# Patient Record
Sex: Male | Born: 1935 | Race: White | Hispanic: No | Marital: Married | Smoking: Never smoker
Health system: Southern US, Community
[De-identification: ages and names within clinical notes are randomized; demographics above are authoritative.]

## PROBLEM LIST (undated history)

## (undated) ENCOUNTER — Inpatient Hospital Stay: Admission: EM | Payer: Self-pay | Source: Home / Self Care

## (undated) DIAGNOSIS — R351 Nocturia: Secondary | ICD-10-CM

## (undated) DIAGNOSIS — R350 Frequency of micturition: Secondary | ICD-10-CM

## (undated) DIAGNOSIS — I6521 Occlusion and stenosis of right carotid artery: Secondary | ICD-10-CM

## (undated) DIAGNOSIS — E785 Hyperlipidemia, unspecified: Secondary | ICD-10-CM

## (undated) DIAGNOSIS — Z8719 Personal history of other diseases of the digestive system: Secondary | ICD-10-CM

## (undated) DIAGNOSIS — N183 Chronic kidney disease, stage 3 unspecified: Secondary | ICD-10-CM

## (undated) DIAGNOSIS — K219 Gastro-esophageal reflux disease without esophagitis: Secondary | ICD-10-CM

## (undated) DIAGNOSIS — R3915 Urgency of urination: Secondary | ICD-10-CM

## (undated) DIAGNOSIS — R0602 Shortness of breath: Secondary | ICD-10-CM

## (undated) DIAGNOSIS — E119 Type 2 diabetes mellitus without complications: Secondary | ICD-10-CM

## (undated) DIAGNOSIS — Z8711 Personal history of peptic ulcer disease: Secondary | ICD-10-CM

## (undated) DIAGNOSIS — N400 Enlarged prostate without lower urinary tract symptoms: Secondary | ICD-10-CM

## (undated) DIAGNOSIS — E039 Hypothyroidism, unspecified: Secondary | ICD-10-CM

## (undated) HISTORY — PX: TOTAL KNEE ARTHROPLASTY: SHX125

## (undated) HISTORY — PX: CARDIAC CATHETERIZATION: SHX172

## (undated) HISTORY — DX: Hyperlipidemia, unspecified: E78.5

---

## 1992-04-15 HISTORY — PX: LUMBAR DISC SURGERY: SHX700

## 1997-08-19 ENCOUNTER — Other Ambulatory Visit: Admission: RE | Admit: 1997-08-19 | Discharge: 1997-08-19 | Payer: Self-pay | Admitting: Family Medicine

## 1998-03-06 ENCOUNTER — Other Ambulatory Visit: Admission: RE | Admit: 1998-03-06 | Discharge: 1998-03-06 | Payer: Self-pay | Admitting: Family Medicine

## 1999-04-03 ENCOUNTER — Encounter: Payer: Self-pay | Admitting: Family Medicine

## 1999-04-03 ENCOUNTER — Encounter: Admission: RE | Admit: 1999-04-03 | Discharge: 1999-04-03 | Payer: Self-pay | Admitting: Family Medicine

## 1999-06-20 ENCOUNTER — Encounter: Payer: Self-pay | Admitting: Family Medicine

## 1999-06-20 ENCOUNTER — Encounter: Admission: RE | Admit: 1999-06-20 | Discharge: 1999-06-20 | Payer: Self-pay | Admitting: Family Medicine

## 2000-01-07 ENCOUNTER — Ambulatory Visit (HOSPITAL_COMMUNITY): Admission: RE | Admit: 2000-01-07 | Discharge: 2000-01-07 | Payer: Self-pay | Admitting: Interventional Cardiology

## 2001-12-01 ENCOUNTER — Encounter: Payer: Self-pay | Admitting: Family Medicine

## 2001-12-01 ENCOUNTER — Encounter: Admission: RE | Admit: 2001-12-01 | Discharge: 2001-12-01 | Payer: Self-pay | Admitting: Family Medicine

## 2002-05-27 ENCOUNTER — Encounter: Payer: Self-pay | Admitting: Family Medicine

## 2002-05-27 ENCOUNTER — Encounter: Admission: RE | Admit: 2002-05-27 | Discharge: 2002-05-27 | Payer: Self-pay | Admitting: Family Medicine

## 2002-06-24 ENCOUNTER — Encounter: Payer: Self-pay | Admitting: *Deleted

## 2002-06-24 ENCOUNTER — Encounter: Admission: RE | Admit: 2002-06-24 | Discharge: 2002-06-24 | Payer: Self-pay | Admitting: *Deleted

## 2002-07-06 ENCOUNTER — Encounter: Payer: Self-pay | Admitting: *Deleted

## 2002-07-06 ENCOUNTER — Encounter: Admission: RE | Admit: 2002-07-06 | Discharge: 2002-07-06 | Payer: Self-pay | Admitting: *Deleted

## 2003-05-03 ENCOUNTER — Encounter: Admission: RE | Admit: 2003-05-03 | Discharge: 2003-05-03 | Payer: Self-pay | Admitting: Family Medicine

## 2003-05-10 ENCOUNTER — Ambulatory Visit (HOSPITAL_COMMUNITY): Admission: RE | Admit: 2003-05-10 | Discharge: 2003-05-10 | Payer: Self-pay | Admitting: Physical Therapy

## 2003-05-10 ENCOUNTER — Encounter (INDEPENDENT_AMBULATORY_CARE_PROVIDER_SITE_OTHER): Payer: Self-pay | Admitting: *Deleted

## 2004-10-04 ENCOUNTER — Ambulatory Visit: Payer: Self-pay | Admitting: Physical Medicine & Rehabilitation

## 2004-10-04 ENCOUNTER — Inpatient Hospital Stay (HOSPITAL_COMMUNITY): Admission: RE | Admit: 2004-10-04 | Discharge: 2004-10-08 | Payer: Self-pay | Admitting: Orthopedic Surgery

## 2005-04-12 IMAGING — RF DG ESOPHAGUS
12 series · 19 of 24 positions shown · non-contrast
Comparison: none

CLINICAL DATA: The patient has dysphagia.
 ESOPHAGRAM
 The study was performed with air-contrast technique with the patient in the erect, prone, and supine positions.
 The swallowing function is satisfactory.  There is noted to be mild cervical spondylosis with minimal extrinsic compression on the cervical esophagus.  There is also noted to be Montell Fred?Sayaana diverticulum in the cervical esophagus.  No obstruction is noted to the passage of a tablet in the cervical esophagus.  There is noted to be mild diffuse spasm of the esophagus.  There is a Schatzki?s ring with an associated sliding type hiatal hernia.  The 13 mm tablet is temporarily obstructed in the region of the Schatzki?s ring.  There is noted to be moderate gastroesophageal reflux with the water siphon test.
 IMPRESSION
 Karime Beharry?Sayaana diverticulum in the cervical esophagus.
 Diffuse spasm of the esophagus with a Schatzki?s ring with an associated hiatal hernia which partially obstructs the passage of a barium tablet.  There is moderate associated gastroesophageal reflux.  
 Endoscopy would be suggested for further evaluation, particularly in regards to the Schatzki?s ring and hiatal hernia since there may well be an area of stricture formation of the distal esophagus which does not exactly correspond to the Schatzki?s ring or the gastroesophageal junction area.

[Series 1: run · 1 of 2 slices shown (1 of 12)]
[im 1/2]
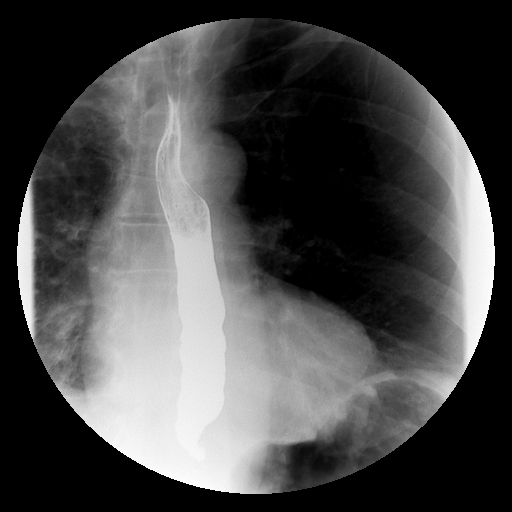

[Series 2: run · 1 of 6 slices shown (2 of 12)]
[im 1/6]
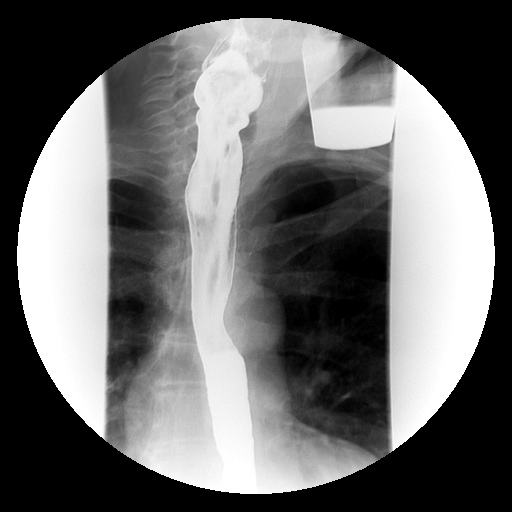

[Series 3: run · 2 of 4 slices shown (3 of 12)]
[im 1/4]
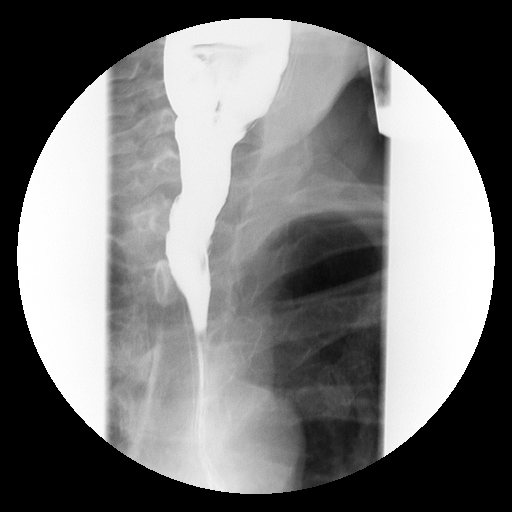
[im 4/4]
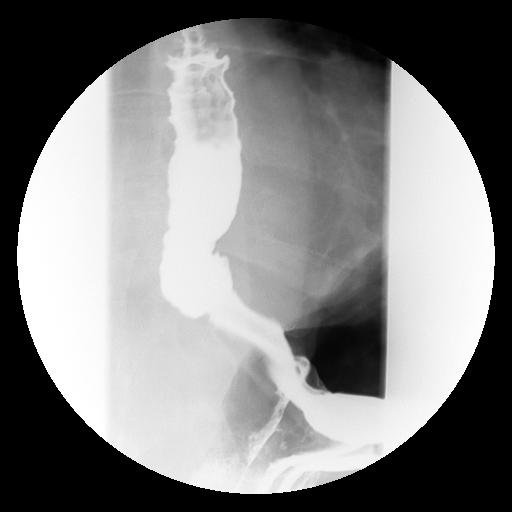

[Series 4: run · 1 of 2 slices shown (4 of 12)]
[im 1/2]
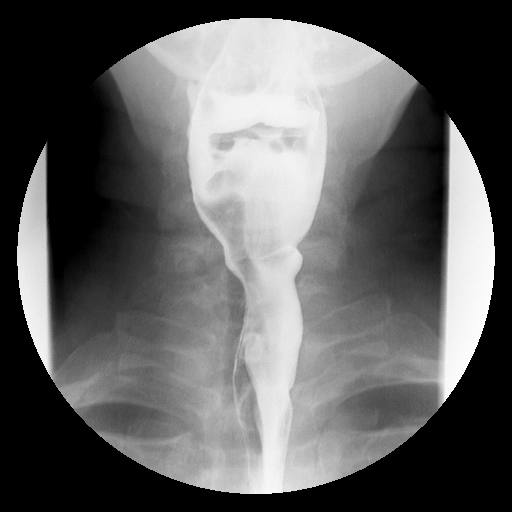

[Series 5: run · 3 of 7 slices shown (5 of 12)]
[im 1/7]
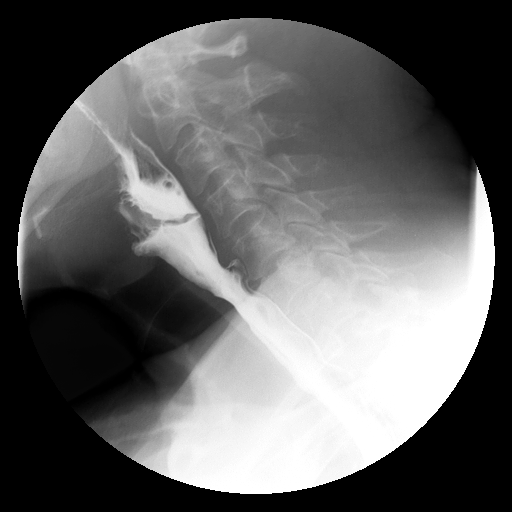
[im 5/7]
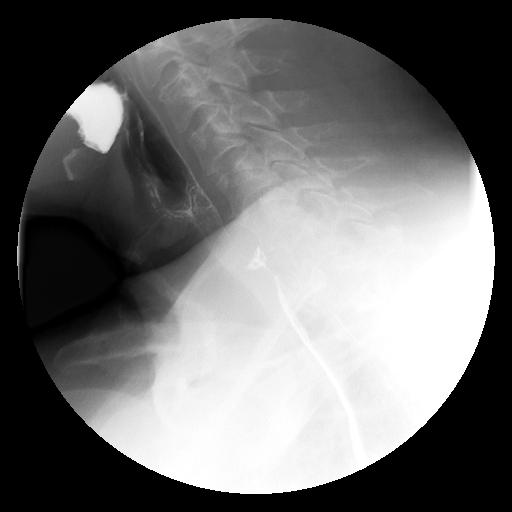
[im 7/7]
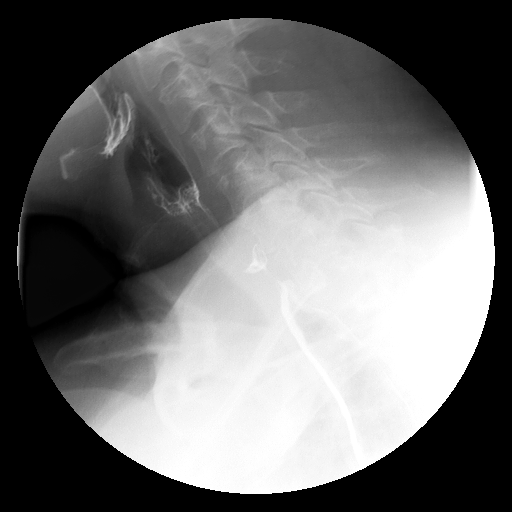

[Series 6: run · 3 of 9 slices shown (6 of 12)]
[im 1/9]
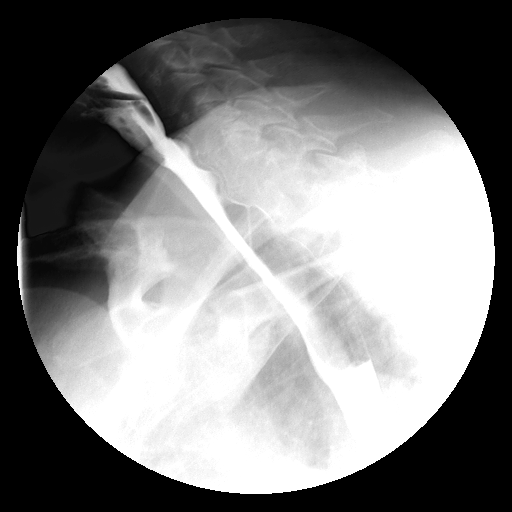
[im 6/9]
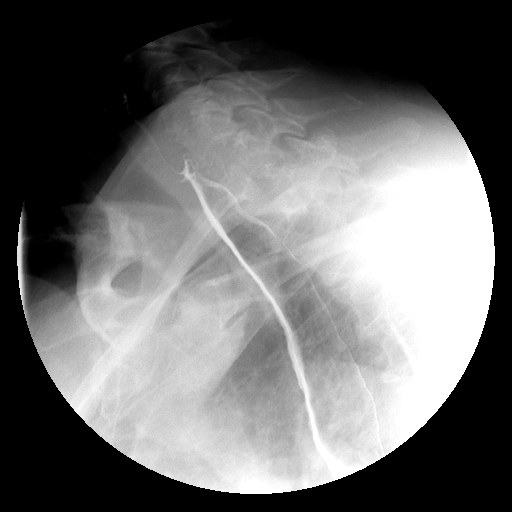
[im 9/9]
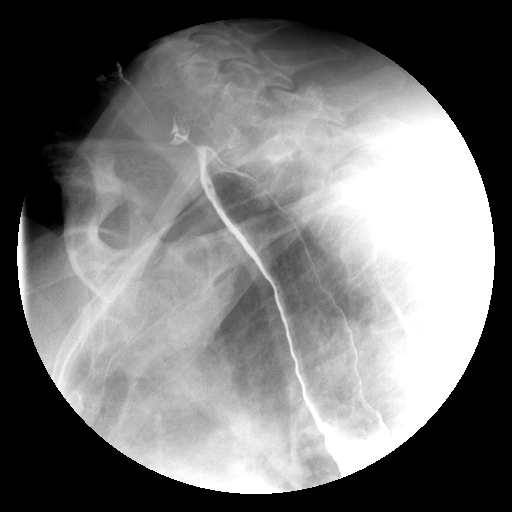

[Series 7: run · 1 of 2 slices shown (7 of 12)]
[im 1/2]
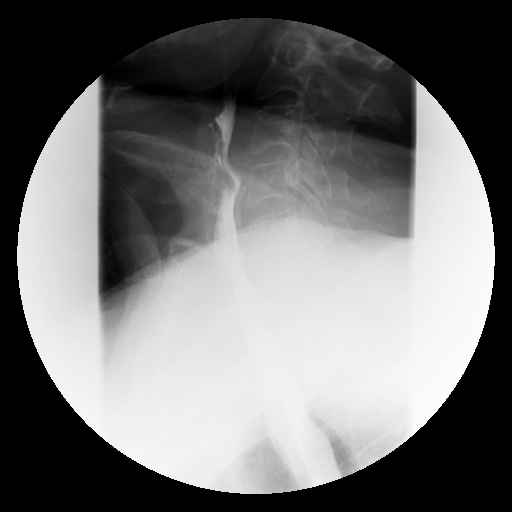

[Series 8: run · 1 of 3 slices shown (8 of 12)]
[im 1/3]
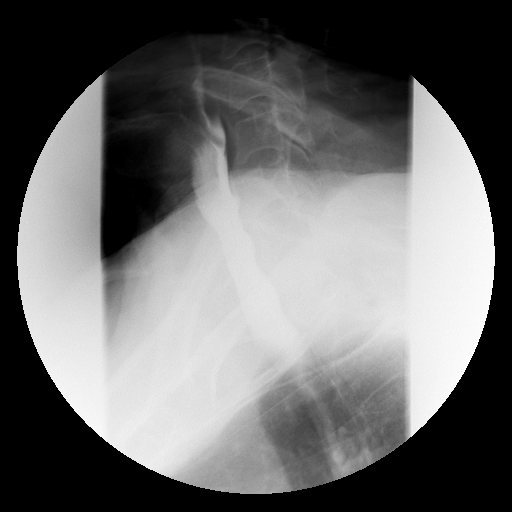

[Series 9: run · 3 of 6 slices shown (9 of 12)]
[im 1/6]
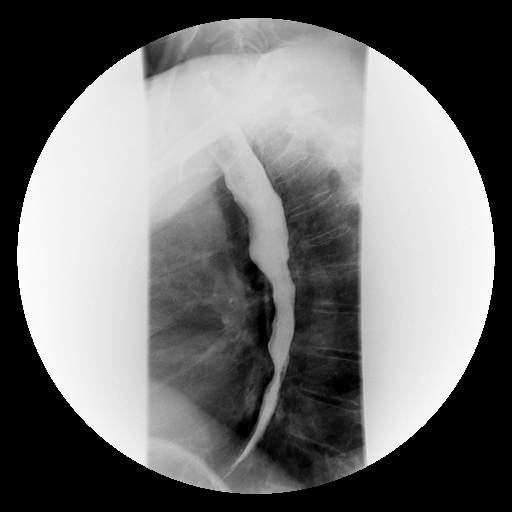
[im 3/6]
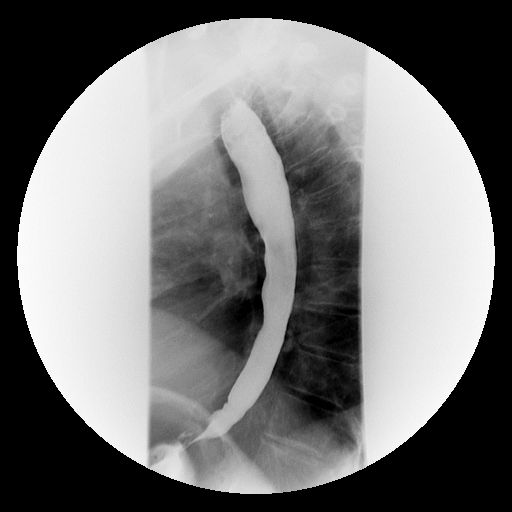
[im 6/6]
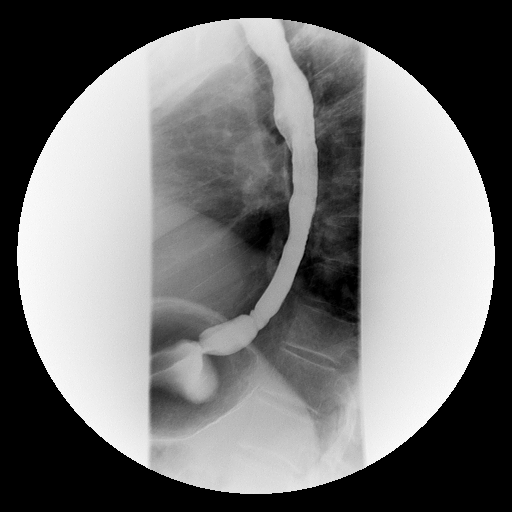

[Series 11: run · 1 of 1 slices shown (10 of 12)]
[im 1/1]
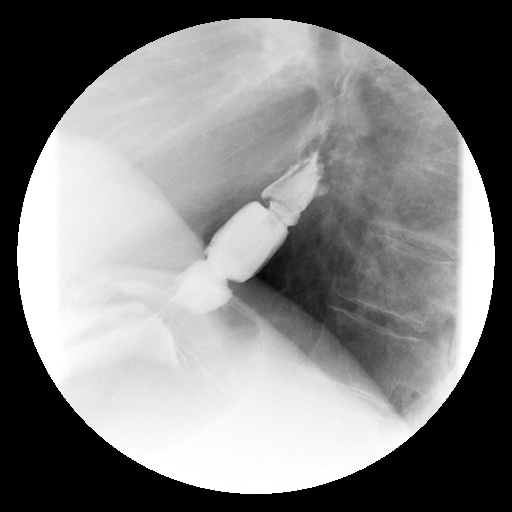

[Series 12: run · 1 of 5 slices shown (11 of 12)]
[im 5/5]
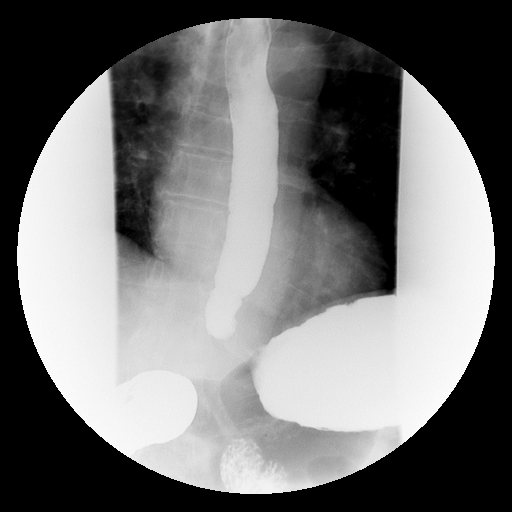

[Series 14: run · 1 of 1 slices shown (12 of 12)]
[im 1/1]
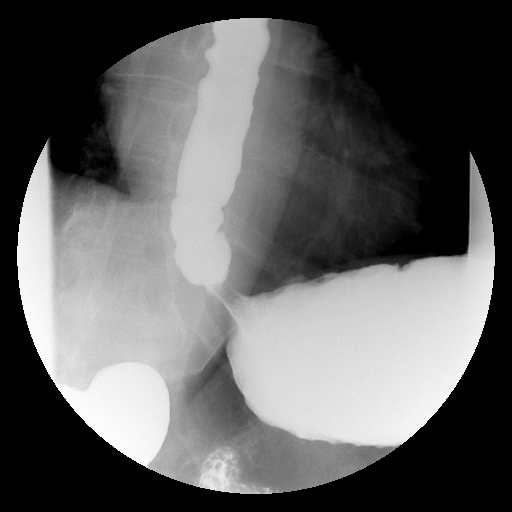

[19 of 24 positions shown; findings below may reference images not displayed]

## 2005-10-10 ENCOUNTER — Encounter: Admission: RE | Admit: 2005-10-10 | Discharge: 2005-10-10 | Payer: Self-pay | Admitting: Family Medicine

## 2008-03-17 ENCOUNTER — Inpatient Hospital Stay (HOSPITAL_COMMUNITY): Admission: RE | Admit: 2008-03-17 | Discharge: 2008-03-20 | Payer: Self-pay | Admitting: Orthopedic Surgery

## 2010-08-28 NOTE — Op Note (Signed)
NAME:  Joshua [name], Joshua [name] NO.:  [id number]   MEDICAL RECORD NO.:  [id number]          PATIENT TYPE:  INP   LOCATION:  5002                         FACILITY:  [facility]   PHYSICIAN:  Nadara Mustard, MD     DATE OF BIRTH:  1928-07-11   DATE OF PROCEDURE:  03/17/2008  DATE OF DISCHARGE:                               OPERATIVE REPORT   PREOPERATIVE DIAGNOSIS:  Osteoarthritis, right knee.   POSTOPERATIVE DIAGNOSIS:  Osteoarthritis, right knee.   PROCEDURE:  Right total knee arthroplasty with DePuy components #4  femur, #4 tibia, 10-mm poly tray with a 38-mm patella.   SURGEON:  Nadara Mustard, MD   ANESTHESIA:  General plus femoral block.   ESTIMATED BLOOD LOSS:  Minimal.   ANTIBIOTICS:  1 g of Kefzol.   DRAINS:  None.   COMPLICATIONS:  None.   TOURNIQUET TIME:  44 minutes at 350 mmHg to the thigh.   DISPOSITION:  To PACU in stable condition.   INDICATIONS FOR PROCEDURE:  The patient is a 77 year old gentleman who  is status post a left total knee arthroplasty.  He is having increasing  pain in the right knee and presents at this time for right total knee  arthroplasty.  The risks and benefits were discussed including  infection, neurovascular injury, persistent pain, need for additional  surgery, as well as DVT and pulmonary embolus.  The patient states he  understands and wished to proceed at this time.   DESCRIPTION OF PROCEDURE:  The patient was brought to OR room #4 and  underwent a general anesthetic after a femoral block.  After adequate  level of anesthesia was obtained, the patient's right lower extremity  was prepped using DuraPrep and draped in a sterile field.  An [name] was  used to cover all exposed skin.  A midline incision was made, carried  down with a medial parapatellar retinacular incision.  The patella was  everted.  The drill was used for the start of the IM entry portal for  the IM femoral guide.  This was set for 5 degrees of valgus.  The  distal  cut was made at 11 mm, this was sized for a size #4 and a size #4  chamfer block was placed and size #4 chamfer cuts were made.  Attention  was then focused on the tibia.  External alignment was used for neutral  varus valgus and neutral posterior slope.  This was set to take 10 mm  off the lateral tibial plateau.  The cut was made and there was still  tightness in the medial joint line, and the medial capsule was elevated  subperiosteally to balance the ligaments.  The #4 tibial trial was  placed, and the keel punch was made.  The box cut was then made on the  femur followed by placing the trial femoral implant, the lug holes were  drilled, size #10 tibial tray was placed, and the knee was placed  through a full range of motion.  There was full extension and flexion.  There was stable varus and valgus stress with the ligaments.  Trial  instruments removed.  The patella was resurfaced, 10 mm was taken off  the patella, and a size 38 lug cuts were made for the patella.  The knee  was irrigated with pulsatile lavage.  The cement was mixed.  The  popliteal fossa was injected with 30 mL of 0.5% Marcaine plain.  Care  was taken not to have the injection intravascular.  The tibial tray was  cemented in place.  The femoral tray was cemented in place.  The loose  cement was removed.  The wound was irrigated with pulsatile lavage.  The  tibial tray was placed.  The knee was kept in extension until the cement  had hardened.  The patella was then cemented in place and also left  clamped until the cement hardened.  The knee was irrigated with  pulsatile lavage.  The tourniquet was deflated after 44 minutes and  hemostasis was obtained.  The knee was then placed through a full range  of motion after the cement had hardened and the patella tracked midline,  there was no subluxation.  The retinaculum was closed using #1 Vicryl,  subcu was closed using 2-0 Vicryl, the skin was closed using  Proximate  staples.  The wound was covered with Adaptic, orthopedic sponges, ABD  dressing, Webril, and Coban.  The patient was extubated and taken to  PACU in stable condition.      Nadara Mustard, MD  Electronically Signed     MVD/MEDQ  D:  03/17/2008  T:  03/17/2008  Job:  [id number]

## 2010-08-31 NOTE — Cardiovascular Report (Signed)
[place]  Patient:    Joshua [name], Joshua [name]                  MRN: [id number] Proc. Date: 01/07/00 Adm. Date:  [id number] Attending:  Lyn Records. Iii CC:         Dyanne Carrel, M.D.   Cardiac Catheterization  INDICATIONS FOR PROCEDURE:  Recent stress Cardiolite suggesting an inferior infarct with peri-infarct ischemia in a patient with risks factors for coronary artery disease.  PROCEDURES PERFORMED: 1. Left heart catheterization. 2. Selective coronary angiography. 3. Hand injection left ventriculography.  DESCRIPTION OF PROCEDURE:  After informed consent, a 6 French sheath was inserted into the right femoral artery using the modified Seldinger technique. Due to severe tortuosity and peri-sheath oozing, we upgraded to a 7 French 23 cm long sheath.  This did decrease the peri-sheath bleeding and made catheter manipulation somewhat easier.  We then used a multipurpose catheter and were able via wire manipulation to enter the left ventricle and perform a hand injection.  We then used a #4 left Judkins and a #4 right Judkins 6 French catheters for selective coronary angiography.  The patient tolerated the procedure without complications.  The sheath was removed and hemostasis achieved manually.  RESULTS:  I:  HEMODYNAMIC DATA:     a. The aortic pressure 152/83 mmHg.     b. Left ventricular pressure 160/13 mmHg.     c. No pullback was made because the catheter recoiled into the        ascending aorta during ventriculography.  II:  LEFT VENTRICULOGRAPHY:  The left ventricle is faintly opacified by hand injection and appears to be overall normal.  III:  SELECTIVE CORONARY ANGIOGRAPHY:     a. Left main:  The left main coronary artery is normal.     b. Left anterior descending coronary artery:  The left anterior        descending coronary artery is a large vessel that wraps around the        left ventricular apex.  It gives origin  to two large diagonal branches.        Luminal irregularities are noted in the mid LAD between a first and        second diagonal.  No significant obstruction is noted in the LAD.     c. Circumflex artery:  The circumflex artery is a large vessel giving        origin to one dominant obtuse marginal that trifurcates on the left        lateral wall and is free of any significant obstruction.     d. Right coronary artery:  The right coronary artery is large, dominant,        giving origin to a PDA and two large left ventricular branches.        Minimal plaquing is noted in the right coronary particularly after the        acute marginal branch in the mid RCA that obstructs the vessel by up to        25%.  CONCLUSIONS: 1. Essentially normal coronary arteries with minimal plaquing noted on the    right coronary and left anterior descending. 2. Normal left ventricular function. 3. Severe tortuosity in the aortoiliac and femoral territory making the    procedure more difficult but no complications occurred. 4. Falsely abnormal stress Cardiolite.  The inferior wall defect possibly    represents diaphragm attenuation.  RECOMMENDATIONS:  No further cardiac evaluation. DD:  01/07/00 TD:  01/07/00 Job: [id number]

## 2010-08-31 NOTE — Op Note (Signed)
NAME:  Joshua [name], Joshua [name]                     ACCOUNT NO.:  [id number]   MEDICAL RECORD NO.:  [id number]                   PATIENT TYPE:  AMB   LOCATION:  ENDO                                 FACILITY:  [facility]   PHYSICIAN:  John C. Madilyn Fireman, M.D.                 DATE OF BIRTH:  12-01-1928   DATE OF PROCEDURE:  05/10/2003  DATE OF DISCHARGE:                                 OPERATIVE REPORT   PROCEDURE:  Esophagogastroduodenoscopy with esophageal dilatation.   INDICATION FOR PROCEDURE:  Dysphagia with lower esophageal ring and Zenker's  diverticulum seen on barium swallow.   DESCRIPTION OF PROCEDURE:  The patient was placed in the left lateral  decubitus position and placed on the pulse monitor with continuous low-flow  oxygen delivered by nasal cannula.  He was sedated with 50 mg mcg IV  fentanyl and 6 mg IV Versed.  The Olympus video endoscope was advanced under  direct vision into the oropharynx and esophagus.  The esophagus was straight  and of normal caliber with the squamocolumnar line at 36 cm above a 2 cm  hiatal hernia and a distinctive lower esophageal ring seen at the Z-line.  There was no resistance to passage of the scope beyond it.  The stomach was  entered, and a small amount of liquid secretions were suctioned from the  fundus.  Retroflexed view of the cardia was unremarkable.  The fundus, body,  antrum, and pylorus all appeared normal.  The duodenum was entered, and both  the bulb and second portion were well-inspected and appeared to be within  normal limits.  The Savary guidewire was placed through the endoscope  channel and the scope withdrawn.  Savary dilators of 16 and 17 mm were  passed consecutively over the guidewire with no significant resistance and  no blood seen on withdrawal.  The last dilator was removed together with the  wire and the patient prepared for colonoscopy.  He tolerated the procedure  well, and there were no immediate complications.   IMPRESSION:  Lower esophageal ring with hiatal hernia.   PLAN:  Advance diet and observe response to dilatation.                                               John C. Madilyn Fireman, M.D.    JCH/MEDQ  D:  05/10/2003  T:  05/10/2003  Job:  [id number]   cc:   Bryan Lemma. Manus Gunning, M.D.  [address] [place]  [place] [address]  Fax: [phone]

## 2010-08-31 NOTE — Discharge Summary (Signed)
NAME:  Joshua [name], Joshua [name] NO.:  [id number]   MEDICAL RECORD NO.:  [id number]          PATIENT TYPE:  INP   LOCATION:  5002                         FACILITY:  [facility]   PHYSICIAN:  Nadara Mustard, MD     DATE OF BIRTH:  1929/04/14   DATE OF ADMISSION:  03/17/2008  DATE OF DISCHARGE:  03/20/2008                               DISCHARGE SUMMARY   FINAL DIAGNOSIS:  Osteoarthritis, right knee.   PROCEDURE:  Right total knee arthroplasty with DePuy components of #4  femur, #4 tibia, 10-mm poly tray, and a 38-mm patella.   Discharged to home in stable condition with [facility], home  health physical therapy, occupational therapy, and CPM.  Prescription  for Tylox, Vicodin, and Coumadin.  Followup in the office in 2 weeks.   HISTORY OF PRESENT ILLNESS:  The patient is a 77 year old gentleman with  osteoarthritis of the right knee, has failed conservative care as pain  with active daily living and presents at this time for total knee  arthroplasty.  The patient's hospital course was essentially  unremarkable.  Postoperatively, he received Kefzol for infection  prophylaxis and Coumadin for DVT prophylaxis.  His diabetes was  controlled with sliding scale insulin and the diabetic diet.  His  hemoglobin dropped to 9.6.  On postoperative day #1, he was started on  physical therapy.  Weightbearing as tolerated.  He had decreased range  of motion of his knee and was started on a CPM machine to improve the  range of motion.  The patient was discharged to home in stable condition  on March 20, 2008, with followup in the office in 2 weeks with  [facility] for therapy and CPM.  His hemoglobin was stable at  8.8 at the time of discharge.  He was discharged with Coumadin for DVT  prophylaxis, and Tylox and Vicodin for pain.      Nadara Mustard, MD  Electronically Signed     MVD/MEDQ  D:  05/05/2008  T:  05/05/2008  Job:  [id number]

## 2010-08-31 NOTE — Discharge Summary (Signed)
NAME:  [name], [name] NO.:  [id number]   MEDICAL RECORD NO.:  [id number]          PATIENT TYPE:  INP   LOCATION:  5032                         FACILITY:  [facility]   PHYSICIAN:  Nadara Mustard, MD     DATE OF BIRTH:  1929-03-02   DATE OF ADMISSION:  10/04/2004  DATE OF DISCHARGE:  10/08/2004                                 DISCHARGE SUMMARY   DIAGNOSIS:  Osteoarthritis, left knee.   PROCEDURE:  Left total knee arthroplasty.   DISPOSITION:  Discharged to home in stable condition with home health  physical therapy from [facility], prescriptions for Vicodin and  Coumadin.   HISTORY OF PRESENT ILLNESS:  The patient is a 77 year old gentleman with  osteoarthritis of the left hip.  He has failed conservative care, has pain  with activities of daily living and wishes to proceed with total knee  arthroplasty.   HOSPITAL COURSE:  Hospital course was essentially unremarkable.  The patient  underwent a left total knee arthroplasty on October 04, 2004.  The patient  received Kefzol for infection prophylaxis and Coumadin for DVT prophylaxis.  Postoperatively, the patient progressed well.  Radiographs shows stable  alignment.  Rehab was consulted; the patient was felt to be safe for  discharge to home and the patient was discharged to home October 08, 2004 with  followup in office in 2 weeks.      Nadara Mustard, MD  Electronically Signed     MVD/MEDQ  D:  12/13/2004  T:  12/13/2004  Job:  [id number]

## 2010-08-31 NOTE — Op Note (Signed)
NAME:  Joshua [name], Joshua [name] NO.:  [id number]   MEDICAL RECORD NO.:  [id number]          PATIENT TYPE:  INP   LOCATION:  2899                         FACILITY:  [facility]   PHYSICIAN:  Nadara Mustard, MD     DATE OF BIRTH:  02/07/1929   DATE OF PROCEDURE:  10/04/2004  DATE OF DISCHARGE:                                 OPERATIVE REPORT   PREOPERATIVE DIAGNOSIS:  Osteoarthritis, left knee.   POSTOPERATIVE DIAGNOSES:  Osteoarthritis, left knee.   PROCEDURE:  Left total knee arthroplasty with DePuy components, #4 tibia, #4  femur, 10 mm poly tray with a 38-mm patella.   SURGEON.:  Nadara Mustard, MD   ANESTHESIA:  General.   ESTIMATED BLOOD LOSS:  Minimal.   ANTIBIOTICS:  1 gram of Kefzol.   TOURNIQUET TIME:  58 minutes at 300 mmHg at the thigh.   DISPOSITION:  To PACU in stable condition.   INDICATIONS FOR PROCEDURE:  The patient is a 76 year old gentleman with  osteoarthritis of his left knee.  He has failed conservative care and  presents at this time for left total knee arthroplasty. Risks and benefits  were discussed including infection, neurovascular injury, persistent pain,  failure of components, DVT, embolus. The patient states he understands and  wished proceed at this time.   DESCRIPTION OF PROCEDURE:  The patient was brought to OR room 1 after  undergoing a femoral block.  Then the patient underwent a general  anesthetic. After an adequate level of anesthesia obtained, the patient's  left lower extremity was prepped using DuraPrep and draped into a sterile  field. Joshua [name] was used to cover all exposed skin. The knee was flexed and the  tourniquet inflated to 300 mmHg. A midline incision was made. This was  carried down to the medial parapatellar retinacular incision. The canal  drill was used and the guide for the distal femoral cut femur cut was  placed. The 11 mm taken off the distal femur. The cutting block was then  sized for size 4 and the anterior  posterior chamfers were then made for the  size 4 femur. Attention was then focused on the tibia. The guide was set to  take 4 mm off the medial tibial plateau. This was set in neutral alignment  in both AP and lateral planes. The tibial cut was made. Attention was then  focused on the femur. The box cut was then made on the femur. The tibia was  sized and the keel cuts were made for the size 4 tibial tray. The trial  components were placed both femoral and tibial. The knee had full extension  and full flexion. The spacer blocks were also used on the case to verify  flexion/extension gap balancing. The patella was then resurfaced and the  size for a 38 and the holes were made for the 38.  The drill holes were made  for the femur. The trial components were removed and the knee was irrigated  with pulse lavage. The tibia and then femur and the patella were cemented in  place.  Loose cement was removed.  Pulse  lavage was used for cleansing.  The  tibial tray was placed and the knee was held in extension. All loose cement  was removed and the knee was further irrigated with pulse lavage and the  knee was held stable in extension until the cement had hardened. A small  lateral release was performed to ensure a midline tracking of the patella.  The patella subluxed slightly without release. Hemostasis was obtained.  The  tourniquet was deflated after 58 minutes. The deep fascial layer was closed  using #1 Vicryl.  The subcu was closed using Vicryl. Skin was closed using  Proximate staples. The wound was covered Adaptic orthopedic sponges, sterile  Webril and a Coban dressing. The patient was extubated, taken to PACU in  stable condition.       MVD/MEDQ  D:  10/04/2004  T:  10/04/2004  Job:  [id number]

## 2010-08-31 NOTE — Op Note (Signed)
NAME:  Joshua [name], Joshua [name]                     ACCOUNT NO.:  [id number]   MEDICAL RECORD NO.:  [id number]                   PATIENT TYPE:  AMB   LOCATION:  ENDO                                 [facility]ACILITY:  [facility]   PHYSICIAN:  John C. Madilyn Fireman, M.D.                 DATE OF BIRTH:  Jun 08, 1928   DATE OF PROCEDURE:  05/10/2003  DATE OF DISCHARGE:                                 OPERATIVE REPORT   PROCEDURE:  Colonoscopy with polypectomy.   ENDOSCOPIST:  Everardo All. Madilyn Fireman, M.D.   INDICATIONS FOR PROCEDURE:  Anemia with documented drop in hemoglobin from  14.0 to 9.9 over the last two years.   DESCRIPTION OF PROCEDURE:  The patient was placed in the left lateral  decubitus position and placed on the pulse monitor with continuous low flow  oxygen delivered by nasal cannula.  He was sedated with 2 mg of IV Versed  and 12.5 mcg of Fentanyl in addition to the medications given for the  previous esophagogastroduodenoscopy.  The Olympus video colonoscope was  inserted into the rectum and advanced to the cecum confirmed by  transillumination of McBurney's point and visualization of the ileocecal  valve and appendiceal orifice.  The prep was good.  In the ascending colon  there was an 8 mm flat sessile polyp that was removed by snare.  The  remainder of the ascending colon appeared normal as did the transverse and  descending colon.  In the sigmoid colon there were several diverticula.  There were numerous small sessile polyps anywhere from 2 to 8 mm seen in the  rectum and sigmoid.  These by number and appearance were most consistent  with hyperplastic polyps.  About three of the larger ones were fulgurated  with hot biopsy.  Several of the smaller ones were fulgurated with the  closed hot biopsy forceps.  The polyp tissue was sent in separate specimen  container with the ascending colon polyp.  The remainder of the rectum  appeared normal.  The scope was then withdrawn and the patient returned to  the  recovery room in stable condition.  He tolerated the procedure well and  there were no immediate complications.   IMPRESSION:  1. Ascending and rectosigmoid colon polyps.  2. Sigmoid diverticulosis.   PLAN:  Await histology and given his anemia we will probably recheck  Hemoccults in a few weeks.                                               John C. Madilyn Fireman, M.D.    JCH/MEDQ  D:  05/10/2003  T:  05/10/2003  Job:  [id number]   cc:   Angelia Mould. Derrell Lolling, M.D.  [address]  [place]  [place] [address]  Fax: [phone]

## 2010-08-31 NOTE — H&P (Signed)
[facility]  Patient:    Joshua [name], Joshua [name]                  MRN: [id number] Adm. Date:  [id number] Attending:  Lyn Records. Iii Dictator:   Anselm Lis, N.P. CC:         Dyanne Carrel, M.D.   History and Physical  PRIMARY CARE Anyeli H[place]en[place]:  Dr. Dyanne Carrel.  DATE OF BIRTH:  1928-04-17  OBJECTIVE:  Mr. Paolini is a very pleasant 77 year old male with history of dislipidemia and diabetes who mentioned to his primary care Dixie Jafri that he was feeling increasing fatigue with exertion over the summer, as well as chronic shortness of breath with moderate activity such as ambulating up a hill.  He was referred for a myocardial perfusion scan at our clinic, which was significant for possible inferior infarction with peri-infarct ischemia versus diaphragmatic attenuation.  There was also a suggestion of some basal septal ischemia.  Ejection fraction was overall normal at 59% but with a suggestion of possible inferior wall motion abnormality.  Patient was subsequently counselled to undergo and has accepted plans for coronary angiography to determine nature of coronary anatomy with possible percutaneous intervention if indicated and able.  PREVIOUS MEDICAL HISTORY: 1. Diabetes mellitus over the last six to seven years. 2. Hypercholesterolemia, on Lipitor (previously on Baycol). 3. Arthritis, affecting right hip. 4. Hiatal hernia. 5. GERD.  ALLERGIES:  No known drug allergies.  Okay with seafood, shellfish, and iodinated products.  MEDICATIONS: 1. Glucotrol 10 mg p.o. q.d. (took dose this morning). 2. Lipitor 10 mg p.o. q.d. 3. Glucophage 500 mg p.o. b.i.d. 4. Celebrex 100 mg p.o. q.d. 5. Pepcid AC once every other day.  PREVIOUS SURGICAL HISTORY:  Lower back surgery x 2, most recently in 1992. First surgery about 10 years earlier.  Denies problems with asthma, cancer, nor hypertension.  SOCIAL HISTORY/HABITS:   Tobacco:  Negative.  ETOH:  Negative.  Caffeine:  Not excessive.  Patient is retired; was a Immunologist.  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  Married for 45 years.   One son, one daughter - alive and well.  FAMILY HISTORY:  Significant for a brother age 39 who has had a prior angiography with stents early age 7s.  Sister who had bypass age 73. Patients mother died of congestive heart failure at age 69, had had MI in older age.  Dad died age 12, had diabetes.  REVIEW OF SYSTEMS:  Wears reading glasses.  Episodic lightheadedness with fast position changes.  Episodic dysphagia, few times over last six weeks.  Hearing okay.  Occasional constipation.  Otherwise, no diarrhea, melena, nor bright red blood PR.  Negative dysuria nor hematuria.  Positive lower extremity swelling, episodic and dependent in nature.  Sleeps with two pillows at night because of GERD.  Denies problems with palpitations.  PHYSICAL EXAMINATION:  VITAL SIGNS:  Blood pressure 153/78 with heart rate 52 and regular, respiratory rate 20, temperature 97.6.  Height 5 feet 8 inches, weight 195 pounds.  GENERAL:  He is a well-nourished 76 year old gentleman in no acute distress. His wife is in attendance.  HEENT:  Brisk bilateral carotid upstroke.  He does have a right supraclavicular bruit radiating to right carotid.  Left carotid with no bruit. No significant JVD nor thyromegaly.  CARDIAC:  Regular rate and rhythm without murmur, rub, or gallop.  Normal S1 and S2.  LUNGS:  Clear with  equal bilateral excursion.  ABDOMEN:  Soft, nondistended, normoactive bowel sounds.  Negative abdominal aorta, renal, left femoral bruit.  EXTREMITIES:  Bilateral radial, femoral, dorsalis pedis, and posterior tibial pulses +2/4.   Negative pedal edema.  NEUROLOGIC:  Cranial nerves 2-12 are grossly intact.  Alert and oriented x 3.  GENITOURINARY/RECTAL:  Deferred.  LABORATORY TESTS AND DATA:  Coags  reveal  PT 12.7 with INR 0.7, PTT 26.  WBC 5.5 with hemoglobin 14.2 and hematocrit 40.3.  Platelet count 170.  Sodium 139, K 4.7, chloride 103, CO2 29, glucose 136, BUN 17, creatinine 1.2, calcium 9.4.  LFTS within normal range.  Stress Cardiolite from December 04, 1999 revealing possible inferior infarction with peri-infarct ischemia versus diaphragmatic attenuation.  Also a suggestion of some basal septal ischemia.  Overall, normal ejection fraction of 59% with a suggestion of possible inferior wall motion abnormality.  Chest x-ray from June 20, 1999 revealed no active lung disease, borderline cardiomegaly.  EKG:  Sinus bradycardia at 56 beats per minute.  T wave abnormality lead 2. Otherwise, no ischemic changes.  He does have some J point elevation, anterior leads with upsloping ST segments.  IMPRESSION: 1. Increasing fatigue with exertion; rule out anginal equivalent.  Follow-up    stress Cardiolite suspicious for old inferior MI with peri-infarct    ischemia versus diaphragmatic attenuation.  Also possible basal septal    ischemia.  Ejection fraction preserved at 59%.  Cardiac risk factors    include family history, history of dislipidemia, and diabetes. 2. Dislipidemia; recently changed to Lipitor from Baycol.  Followed by    Dr. Manus Gunning.  Due for first six week lab follow-up at Dr. Ova Freshwater    office this Wednesday.  His LFTs are okay on this check today. 3. Diabetes mellitus type 2; on Glucotrol and Glucophage.  His CBG is 136 this    morning.  He did take his Glucotrol this morning. 4. Right supraclavicular bruit; will follow up with Dr. Manus Gunning on this. 5. Episodic dysphagia over the last six weeks; will follow up with Dr. Manus Gunning    on this.  PLAN:  Coronary arteriography with possible percutaneous intervention if indicated and able.  Risks, potential complications, benefits, and alternatives to procedure discussed in detail.  Mr. Reppert and his wife  indicate their  questions and concerns have been addressed, and are agreeable to proceed. DD:  01/07/00 TD:  01/07/00 Job: [id number]

## 2011-01-17 LAB — BASIC METABOLIC PANEL
CO2: 26 mEq/L (ref 19–32)
Calcium: 8.3 mg/dL — ABNORMAL LOW (ref 8.4–10.5)
Calcium: 8.3 mg/dL — ABNORMAL LOW (ref 8.4–10.5)
Chloride: 102 mEq/L (ref 96–112)
Chloride: 105 mEq/L (ref 96–112)
Creatinine, Ser: 1.4 mg/dL (ref 0.4–1.5)
GFR calc Af Amer: 56 mL/min — ABNORMAL LOW (ref >60)
GFR calc Af Amer: 59 mL/min — ABNORMAL LOW (ref >60)
GFR calc Af Amer: >60 mL/min (ref >60)
GFR calc non Af Amer: 46 mL/min — ABNORMAL LOW (ref >60)
GFR calc non Af Amer: 49 mL/min — ABNORMAL LOW (ref >60)
Potassium: 4.2 mEq/L (ref 3.5–5.1)
Potassium: 4.3 mEq/L (ref 3.5–5.1)
Sodium: 137 mEq/L (ref 135–145)
Sodium: 138 mEq/L (ref 135–145)

## 2011-01-17 LAB — HEMOGLOBIN A1C
Hgb A1c MFr Bld: 7.2 % — ABNORMAL HIGH (ref 4.6–6.1)
Mean Plasma Glucose: 160 mg/dL

## 2011-01-17 LAB — GLUCOSE, CAPILLARY
Glucose-Capillary: 130 mg/dL — ABNORMAL HIGH (ref 70–99)
Glucose-Capillary: 136 mg/dL — ABNORMAL HIGH (ref 70–99)
Glucose-Capillary: 168 mg/dL — ABNORMAL HIGH (ref 70–99)
Glucose-Capillary: 179 mg/dL — ABNORMAL HIGH (ref 70–99)
Glucose-Capillary: 211 mg/dL — ABNORMAL HIGH (ref 70–99)
Glucose-Capillary: 215 mg/dL — ABNORMAL HIGH (ref 70–99)

## 2011-01-17 LAB — CBC
HCT: 37 % — ABNORMAL LOW (ref 39.0–52.0)
Hemoglobin: 8.7 g/dL — ABNORMAL LOW (ref 13.0–17.0)
Hemoglobin: 8.8 g/dL — ABNORMAL LOW (ref 13.0–17.0)
MCHC: 33.7 g/dL (ref 30.0–36.0)
Platelets: 120 10*3/uL — ABNORMAL LOW (ref 150–400)
Platelets: 127 10*3/uL — ABNORMAL LOW (ref 150–400)
Platelets: 172 10*3/uL (ref 150–400)
RBC: 2.87 MIL/uL — ABNORMAL LOW (ref 4.22–5.81)
WBC: 5 10*3/uL (ref 4.0–10.5)
WBC: 6.3 10*3/uL (ref 4.0–10.5)

## 2011-01-17 LAB — APTT: aPTT: 29 seconds (ref 24–37)

## 2011-01-17 LAB — COMPREHENSIVE METABOLIC PANEL
AST: 16 U/L (ref 0–37)
Alkaline Phosphatase: 51 U/L (ref 39–117)
CO2: 29 mEq/L (ref 19–32)
Calcium: 9.5 mg/dL (ref 8.4–10.5)
Chloride: 105 mEq/L (ref 96–112)
Creatinine, Ser: 1.29 mg/dL (ref 0.4–1.5)
GFR calc Af Amer: >60 mL/min (ref >60)
Total Bilirubin: 0.5 mg/dL (ref 0.3–1.2)

## 2011-01-17 LAB — PROTIME-INR
INR: 1 (ref 0.00–1.49)
INR: 1.2 (ref 0.00–1.49)
INR: 1.3 (ref 0.00–1.49)
INR: 1.5 (ref 0.00–1.49)
Prothrombin Time: 15.1 seconds (ref 11.6–15.2)
Prothrombin Time: 18.3 seconds — ABNORMAL HIGH (ref 11.6–15.2)

## 2011-02-13 ENCOUNTER — Other Ambulatory Visit: Payer: Self-pay | Admitting: Family Medicine

## 2011-02-13 ENCOUNTER — Ambulatory Visit
Admission: RE | Admit: 2011-02-13 | Discharge: 2011-02-13 | Disposition: A | Discharge status: Home / Self Care | Payer: Medicare Other | Source: Ambulatory Visit | Attending: Family Medicine | Admitting: Family Medicine

## 2011-02-13 DIAGNOSIS — R42 Dizziness and giddiness: Secondary | ICD-10-CM

## 2011-02-19 ENCOUNTER — Ambulatory Visit: Payer: Medicare Other | Attending: Family Medicine | Admitting: Physical Therapy

## 2011-02-19 DIAGNOSIS — R42 Dizziness and giddiness: Secondary | ICD-10-CM | POA: Insufficient documentation

## 2011-02-19 DIAGNOSIS — R269 Unspecified abnormalities of gait and mobility: Secondary | ICD-10-CM | POA: Insufficient documentation

## 2011-02-19 DIAGNOSIS — IMO0001 Reserved for inherently not codable concepts without codable children: Secondary | ICD-10-CM | POA: Insufficient documentation

## 2011-03-01 ENCOUNTER — Ambulatory Visit: Payer: Medicare Other | Admitting: Physical Therapy

## 2011-07-22 ENCOUNTER — Other Ambulatory Visit: Payer: Self-pay | Admitting: Gastroenterology

## 2012-01-26 ENCOUNTER — Ambulatory Visit: Payer: Self-pay | Admitting: Family Medicine

## 2012-01-26 VITALS — BP 145/80 | HR 88 | Temp 98.0°F | Resp 18 | Ht 67.0 in | Wt 195.0 lb

## 2012-01-26 DIAGNOSIS — Z0289 Encounter for other administrative examinations: Secondary | ICD-10-CM

## 2012-01-26 DIAGNOSIS — Z0000 Encounter for general adult medical examination without abnormal findings: Secondary | ICD-10-CM

## 2012-01-26 NOTE — Progress Notes (Signed)
[facility]:  Office Visit  Chief Complaint:  Chief Complaint  Patient presents with  . Annual Exam    DOT    HPI: Joshua [name] is a 78 y.o. 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 He denies having any CP, SOB, dizziness, weakness, confusion or memory loss. His vision is good. No double vision.   Past Medical History  Diagnosis Date  . Hyperlipidemia   . Thyroid disease   . BPH (benign prostatic hyperplasia)   . Diabetes mellitus without complication    Past Surgical History  Procedure Date  . Knee surgery   . Spine surgery    History   Social History  . Marital Status: Married    Spouse Name: N/A    Number of Children: N/A  . Years of Education: N/A   Social History Main Topics  . Smoking status: Never Smoker   . Smokeless tobacco: None  . Alcohol Use: No  . Drug Use: No  . Sexually Active: None   Other Topics Concern  . None   Social History Narrative  . None   No family history on file. No Known Allergies Prior to Admission medications   Medication Sig Start Date End Date Taking? Authorizing Provider  glipiZIDE (GLUCOTROL XL) 10 MG 24 hr tablet Take 10 mg by mouth daily.   Yes Historical Provider, MD  levothyroxine (SYNTHROID, LEVOTHROID) 25 MCG tablet Take 25 mcg by mouth daily.   Yes Historical Provider, MD  metFORMIN (GLUCOPHAGE) 1000 MG tablet Take 1,000 mg by mouth 2 (two) times daily with a meal.   Yes Historical Provider, MD  omeprazole (PRILOSEC) 20 MG capsule Take 20 mg by mouth daily.   Yes Historical Provider, MD  silodosin (RAPAFLO) 8 MG CAPS capsule Take 8 mg by mouth daily with breakfast.   Yes Historical Provider, MD  simvastatin (ZOCOR) 40 MG tablet Take 20 mg by mouth every evening.   Yes Historical Provider, MD  sitaGLIPtin (JANUVIA) 100 MG tablet Take 100 mg by mouth daily.   Yes Historical Provider, MD     ROS: The patient denies fevers,  chills, night sweats, unintentional weight loss, chest pain, palpitations, wheezing, dyspnea on exertion, nausea, vomiting, abdominal pain, dysuria, hematuria, melena, numbness, weakness, or tingling.   All other systems have been reviewed and were otherwise negative with the exception of those mentioned in the HPI and as above.    PHYSICAL EXAM: Filed Vitals:   01/26/12 1405  BP: 145/80  Pulse: 88  Temp: 98 F (36.7 C)  Resp: 18   Filed Vitals:   01/26/12 1405  Height: 5\' 7"  (1.702 m)  Weight: 195 lb (88.451 kg)   Body mass index is 30.54 kg/(m^2).  General: Alert, no acute distress HEENT:  Normocephalic, atraumatic, oropharynx patent.  Cardiovascular:  Regular rate and rhythm, no rubs murmurs or gallops.  No Carotid bruits, radial pulse intact. No pedal edema.  Respiratory: Clear to auscultation bilaterally.  No wheezes, rales, or rhonchi.  No cyanosis, no use of accessory musculature GI: No organomegaly, abdomen is soft and non-tender, positive bowel sounds.  No masses. Skin: No rashes. Neurologic: Facial musculature symmetric. Psychiatric: Patient is appropriate throughout our interaction. Lymphatic: No cervical lymphadenopathy Musculoskeletal: Gait intact. ROM intact for neck, back Alyona Romack, and UE. 5/5 strength. 2/2 DTR.    LABS: Results for orders placed during the hospital encounter of 03/17/08  CBC  Component Value Range   WBC 5.0  4.0 - 10.5 K/uL   RBC 4.15 (*) 4.22 - 5.81 MIL/uL   Hemoglobin 12.3 (*) 13.0 - 17.0 g/dL   HCT 16.1 (*) 09.6 - 04.5 %   MCV 89.3  78.0 - 100.0 fL   MCHC 33.3  30.0 - 36.0 g/dL   RDW 40.9  81.1 - 91.4 %   Platelets 172  150 - 400 K/uL  COMPREHENSIVE METABOLIC PANEL      Component Value Range   Sodium 139  135 - 145 mEq/L   Potassium 4.1  3.5 - 5.1 mEq/L   Chloride 105  96 - 112 mEq/L   CO2 29  19 - 32 mEq/L   Glucose, Bld 119 (*) 70 - 99 mg/dL   BUN 19  6 - 23 mg/dL   Creatinine, Ser 7.82  0.4 - 1.5 mg/dL   Calcium 9.5  8.4 - 95.6  mg/dL   Total Protein 6.3  6.0 - 8.3 g/dL   Albumin 3.7  3.5 - 5.2 g/dL   AST 16  0 - 37 U/L   ALT 10  0 - 53 U/L   Alkaline Phosphatase 51  39 - 117 U/L   Total Bilirubin 0.5  0.3 - 1.2 mg/dL   GFR calc non Af Amer 54 (*) >60 mL/min   GFR calc Af Amer    >60 mL/min   Value: >60            The eGFR has been calculated     using the MDRD equation.     This calculation has not been     validated in all clinical  PROTIME-INR      Component Value Range   Prothrombin Time 12.9  11.6 - 15.2 seconds   INR 1.0  0.00 - 1.49  APTT      Component Value Range   aPTT 29  24 - 37 seconds  GLUCOSE, CAPILLARY      Component Value Range   Glucose-Capillary 168 (*) 70 - 99 mg/dL  GLUCOSE, CAPILLARY      Component Value Range   Glucose-Capillary 179 (*) 70 - 99 mg/dL   Comment 1 Notify RN    GLUCOSE, CAPILLARY      Component Value Range   Glucose-Capillary 199 (*) 70 - 99 mg/dL   Comment 1 Notify RN    BASIC METABOLIC PANEL      Component Value Range   Sodium 137  135 - 145 mEq/L   Potassium 4.3  3.5 - 5.1 mEq/L   Chloride 102  96 - 112 mEq/L   CO2 28  19 - 32 mEq/L   Glucose, Bld 177 (*) 70 - 99 mg/dL   BUN 12  6 - 23 mg/dL   Creatinine, Ser 2.13  0.4 - 1.5 mg/dL   Calcium 8.2 (*) 8.4 - 10.5 mg/dL   GFR calc non Af Amer 46 (*) >60 mL/min   GFR calc Af Amer   (*) >60 mL/min   Value: 56            The eGFR has been calculated     using the MDRD equation.     This calculation has not been     validated in all clinical  CBC      Component Value Range   WBC 6.3  4.0 - 10.5 K/uL   RBC 3.19 (*) 4.22 - 5.81 MIL/uL   Hemoglobin 9.6 (*) 13.0 - 17.0 g/dL  HCT 29.0 (*) 39.0 - 52.0 %   MCV 90.8  78.0 - 100.0 fL   MCHC 33.2  30.0 - 36.0 g/dL   RDW 16.1  09.6 - 04.5 %   Platelets 127 (*) 150 - 400 K/uL  PROTIME-INR      Component Value Range   Prothrombin Time 15.1  11.6 - 15.2 seconds   INR 1.2  0.00 - 1.49  HEMOGLOBIN A1C      Component Value Range   Hemoglobin A1C   (*) 4.6 - 6.1  %   Value: 7.2     (NOTE)   The ADA recommends the following therapeutic goal for glycemic   control related to Hgb A1C measurement:   Goal of Therapy:   < 7.0% Hgb A1C   Reference: American Diabetes Association: Clinical Practice   Recommendations 2008, Diabetes Care,      2008, 31:(Suppl 1).   Mean Plasma Glucose 160  None avail mg/dL  GLUCOSE, CAPILLARY      Component Value Range   Glucose-Capillary 173 (*) 70 - 99 mg/dL   Comment 1 Notify RN     Comment 2 Documented in Chart    GLUCOSE, CAPILLARY      Component Value Range   Glucose-Capillary 211 (*) 70 - 99 mg/dL   Comment 1 Notify RN     Comment 2 Documented in Chart    GLUCOSE, CAPILLARY      Component Value Range   Glucose-Capillary 256 (*) 70 - 99 mg/dL  GLUCOSE, CAPILLARY      Component Value Range   Glucose-Capillary 149 (*) 70 - 99 mg/dL   Comment 1 Notify RN    BASIC METABOLIC PANEL      Component Value Range   Sodium 138  135 - 145 mEq/L   Potassium 4.2  3.5 - 5.1 mEq/L   Chloride 105  96 - 112 mEq/L   CO2 26  19 - 32 mEq/L   Glucose, Bld 168 (*) 70 - 99 mg/dL   BUN 13  6 - 23 mg/dL   Creatinine, Ser 4.09  0.4 - 1.5 mg/dL   Calcium 8.3 (*) 8.4 - 10.5 mg/dL   GFR calc non Af Amer 54 (*) >60 mL/min   GFR calc Af Amer    >60 mL/min   Value: >60            The eGFR has been calculated     using the MDRD equation.     This calculation has not been     validated in all clinical  CBC      Component Value Range   WBC 5.7  4.0 - 10.5 K/uL   RBC 2.88 (*) 4.22 - 5.81 MIL/uL   Hemoglobin 8.7 (*) 13.0 - 17.0 g/dL   HCT 81.1 (*) 91.4 - 78.2 %   MCV 89.3  78.0 - 100.0 fL   MCHC 33.7  30.0 - 36.0 g/dL   RDW 95.6  21.3 - 08.6 %   Platelets 120 (*) 150 - 400 K/uL  PROTIME-INR      Component Value Range   Prothrombin Time 16.2 (*) 11.6 - 15.2 seconds   INR 1.3  0.00 - 1.49  GLUCOSE, CAPILLARY      Component Value Range   Glucose-Capillary 163 (*) 70 - 99 mg/dL   Comment 1 Documented in Chart     Comment 2 Notify RN     GLUCOSE, CAPILLARY      Component Value Range  Glucose-Capillary 215 (*) 70 - 99 mg/dL   Comment 1 Documented in Chart     Comment 2 Notify RN    GLUCOSE, CAPILLARY      Component Value Range   Glucose-Capillary 141 (*) 70 - 99 mg/dL   Comment 1 Documented in Chart     Comment 2 Notify RN    GLUCOSE, CAPILLARY      Component Value Range   Glucose-Capillary 136 (*) 70 - 99 mg/dL   Comment 1 Documented in Chart     Comment 2 Notify RN    BASIC METABOLIC PANEL      Component Value Range   Sodium 138  135 - 145 mEq/L   Potassium 4.4  3.5 - 5.1 mEq/L   Chloride 104  96 - 112 mEq/L   CO2 29  19 - 32 mEq/L   Glucose, Bld 142 (*) 70 - 99 mg/dL   BUN 14  6 - 23 mg/dL   Creatinine, Ser 1.61  0.4 - 1.5 mg/dL   Calcium 8.3 (*) 8.4 - 10.5 mg/dL   GFR calc non Af Amer 49 (*) >60 mL/min   GFR calc Af Amer   (*) >60 mL/min   Value: 59            The eGFR has been calculated     using the MDRD equation.     This calculation has not been     validated in all clinical  CBC      Component Value Range   WBC 5.0  4.0 - 10.5 K/uL   RBC 2.87 (*) 4.22 - 5.81 MIL/uL   Hemoglobin 8.8 (*) 13.0 - 17.0 g/dL   HCT 09.6 (*) 04.5 - 40.9 %   MCV 89.7  78.0 - 100.0 fL   MCHC 34.0  30.0 - 36.0 g/dL   RDW 81.1  91.4 - 78.2 %   Platelets 97 (*) 150 - 400 K/uL  PROTIME-INR      Component Value Range   Prothrombin Time 18.3 (*) 11.6 - 15.2 seconds   INR 1.5  0.00 - 1.49  GLUCOSE, CAPILLARY      Component Value Range   Glucose-Capillary 130 (*) 70 - 99 mg/dL  GLUCOSE, CAPILLARY      Component Value Range   Glucose-Capillary 155 (*) 70 - 99 mg/dL     EKG/XRAY:   Primary read interpreted by Dr. Conley Rolls at [facility].   ASSESSMENT/PLAN: Encounter Diagnosis  Name Primary?  . Physical exam, routine Yes   DOT-no physical restrictions based on today's exam.  Needs to return in 1 year due to diabetes.      Hailyn Zarr PHUONG, DO 01/27/2012 2:06 PM

## 2012-12-07 ENCOUNTER — Ambulatory Visit: Payer: Self-pay | Admitting: Emergency Medicine

## 2012-12-07 VITALS — BP 122/80 | HR 74 | Temp 98.2°F | Resp 20 | Ht 66.75 in | Wt 199.8 lb

## 2012-12-07 DIAGNOSIS — Z0289 Encounter for other administrative examinations: Secondary | ICD-10-CM

## 2012-12-08 ENCOUNTER — Encounter: Payer: Self-pay | Admitting: Emergency Medicine

## 2012-12-17 NOTE — Progress Notes (Signed)
Dr Ewell Poe patient DOT PE

## 2012-12-31 ENCOUNTER — Other Ambulatory Visit: Payer: Self-pay | Admitting: Urology

## 2013-01-26 ENCOUNTER — Encounter (HOSPITAL_BASED_OUTPATIENT_CLINIC_OR_DEPARTMENT_OTHER): Payer: Self-pay | Admitting: *Deleted

## 2013-01-27 ENCOUNTER — Encounter (HOSPITAL_BASED_OUTPATIENT_CLINIC_OR_DEPARTMENT_OTHER): Payer: Self-pay | Admitting: *Deleted

## 2013-01-27 NOTE — Progress Notes (Signed)
NPO AFTER MN. ARRIVES AT 0830. NEEDS ISTAT AND EKG. WILL TAKE PRILOSEC AM DOS W/ SIP OF WATER. REVIEWED [facility] GUIDELINES, WILL BRING MEDS.

## 2013-01-29 NOTE — H&P (Signed)
Reason For Visit     Joshua [name] returns discussion status post his video urodynamics. He has had some long-standing progressive voiding symptoms and recently complained of his situation had worsened. He remained on alpha-blocker therapy be continued complaint of hesitancy and a weak stream. Nocturia has increased a 3-4 times per evening and he had increased feelings of incomplete emptying. Postvoid residual was noted to be relatively low but still higher than it had been historically. We suggested consideration for video urodynamics which was performed recently.  On urodynamics the patient's initial postvoid residual was 75 cc. Bladder sensation was fairly normal on 2 mildly hypersensitive. The patient did develop some bladder instability. This occurred initially at just over 300 mL. On pressure flow studies the patient was able to generate a detrusor contraction. Maximum flow was 14 mL per second with a pressure of 59 cm water and a maximum pressure of 86 cm of water. Based on nomogram data this does put him in the obstructive category. The patient was also noted to have bilateral ureteral reflux noted on filling as well as during voiding. The patient was not have bladder trabeculation and diverticuli as well as an elevation in his bladder base on the fluoroscopic portion of these studies.       Past Medical History Problems  1. History of  Arthritis V13.4 2. History of  Diabetes Mellitus 250.00 3. History of  Heartburn 787.1 4. History of  Hypercholesterolemia 272.0  Surgical History Problems  1. History of  Back Surgery 2. History of  Knee Surgery  Current Meds 1. Alfuzosin HCl ER 10 MG Oral Tablet Extended Release 24 Hour; Therapy: 28Jan2014 to 2. Doxazosin Mesylate 4 MG Oral Tablet; Therapy: 11Dec2013 to 3. GlipiZIDE XL 10 MG Oral Tablet Extended Release 24 Hour; Therapy: 09Nov2012 to 4. HumuLIN N KwikPen 100 UNIT/ML Subcutaneous Suspension Pen-injector; Therapy:  09Jun2014  to 5. Januvia 100 MG Oral Tablet; Therapy: 23Feb2012 to 6. Levothyroxine Sodium 50 MCG Oral Tablet; Therapy: 10Jun2014 to 7. MetFORMIN HCl 1000 MG Oral Tablet; Therapy: (Recorded:17Jan2008) to 8. Omeprazole 20 MG Oral Tablet Delayed Release; Therapy: (Recorded:15Feb2010) to 9. Simvastatin 40 MG Oral Tablet; Therapy: 31Aug2010 to  Allergies Medication  1. No Known Drug Allergies  Family History Problems  1. Paternal history of  Diabetes Mellitus V18.0 2. Family history of  Family Health Status Number Of Children 1 son; 1 daughter 3. Paternal history of  Heart Disease V17.49 4. Maternal history of  Heart Disease V17.49  Social History Problems  1. Family history of  Death In The Family Father 40, diabetes 2. Family history of  Death In The Family Mother 12, heart 3. Marital History - Currently Married 4. Never A Smoker  Review of Systems Genitourinary, constitutional, skin, eye, otolaryngeal, hematologic/lymphatic, cardiovascular, pulmonary, endocrine, musculoskeletal, gastrointestinal, neurological and psychiatric system(s) were reviewed and pertinent findings if present are noted.  Genitourinary: urinary frequency, feelings of urinary urgency, nocturia (3-4 x), difficulty starting the urinary stream, weak urinary stream, incomplete emptying of bladder and post-void dribbling, but no dysuria and no hematuria.  Constitutional: feeling tired (fatigue).  Hematologic/Lymphatic: a tendency to easily bruise.  Neurological: dizziness.    Vitals Vital Signs [Data Includes: Last 1 Day]  18Sep2014 02:34PM  Blood Pressure: 142 / 81 Heart Rate: 63  Well-developed well-nourished male in no acute distress Respiratory: Normal effort Cardiac: Regular rate and rhythm Abdomen: Soft nontender no palpable masses GU: Normal external genitalia. Extremities: No tenderness or edema Neurology: Nonfocal  Results/Data Urine [Data Includes:  Last 1 Day]   18Sep2014 COLOR YELLOW   APPEARANCE CLEAR  SPECIFIC GRAVITY 1.020  pH 5.5  GLUCOSE 100 mg/dL BILIRUBIN NEG  KETONE NEG mg/dL BLOOD NEG  PROTEIN NEG mg/dL UROBILINOGEN 0.2 mg/dL NITRITE NEG  LEUKOCYTE ESTERASE NEG   Assessment Assessed  1. Bladder Neck Obstruction 596.0 2. Nocturia 788.43 3. Benign Prostatic Hypertrophy 600.00 4. Vesicoureteral Reflux 593.70  Plan  Bladder Neck Obstruction (596.0)  1. Follow-up Schedule Surgery Office  Follow-up  Done: 18Sep2014  UA With REFLEX  Status: Resulted - Requires Verification  Done: 01Jan0001 12:00AM Ordered Today; For: Health Maintenance (V70.0); Ordered By: Barron Alvine  Due: 20Sep2014 Marked Important; Last Updated By: Thomasenia Sales   Discussion/Summary     Joshua [name] continues to have progressive, obstructive, and irritative voiding symptoms. Urodynamics does show evidence of obstruction based on pressure flow analysis. He has a degree of incomplete bladder emptying as well as some bladder instability. He has failed alpha-blocker therapy. At this point I think he is a good candidate for TURP. We discussed the procedure risks and benefits. I do think that based on his urodynamic study is that this has a very high likelihood of significantly improving his overall voiding situation. I would anticipate overnight stay with approximately 2-3 days of catheter drainage.  cc: Blair Heys, MD      Signatures

## 2013-02-01 ENCOUNTER — Other Ambulatory Visit: Payer: Self-pay

## 2013-02-01 ENCOUNTER — Ambulatory Visit (HOSPITAL_BASED_OUTPATIENT_CLINIC_OR_DEPARTMENT_OTHER): Payer: Medicare Other | Admitting: Anesthesiology

## 2013-02-01 ENCOUNTER — Ambulatory Visit (HOSPITAL_BASED_OUTPATIENT_CLINIC_OR_DEPARTMENT_OTHER)
Admission: RE | Admit: 2013-02-01 | Discharge: 2013-02-02 | Disposition: A | Discharge status: Home / Self Care | Payer: Medicare Other | Source: Ambulatory Visit | Attending: Urology | Admitting: Urology

## 2013-02-01 ENCOUNTER — Encounter (HOSPITAL_BASED_OUTPATIENT_CLINIC_OR_DEPARTMENT_OTHER)
Admission: RE | Disposition: A | Discharge status: Home / Self Care | Payer: Self-pay | Source: Ambulatory Visit | Attending: Urology

## 2013-02-01 ENCOUNTER — Encounter (HOSPITAL_BASED_OUTPATIENT_CLINIC_OR_DEPARTMENT_OTHER): Payer: Medicare Other | Admitting: Anesthesiology

## 2013-02-01 ENCOUNTER — Encounter (HOSPITAL_BASED_OUTPATIENT_CLINIC_OR_DEPARTMENT_OTHER): Payer: Self-pay | Admitting: *Deleted

## 2013-02-01 DIAGNOSIS — I739 Peripheral vascular disease, unspecified: Secondary | ICD-10-CM | POA: Insufficient documentation

## 2013-02-01 DIAGNOSIS — R0602 Shortness of breath: Secondary | ICD-10-CM | POA: Insufficient documentation

## 2013-02-01 DIAGNOSIS — E7800 Pure hypercholesterolemia, unspecified: Secondary | ICD-10-CM | POA: Insufficient documentation

## 2013-02-01 DIAGNOSIS — Z794 Long term (current) use of insulin: Secondary | ICD-10-CM | POA: Insufficient documentation

## 2013-02-01 DIAGNOSIS — N320 Bladder-neck obstruction: Secondary | ICD-10-CM | POA: Insufficient documentation

## 2013-02-01 DIAGNOSIS — R12 Heartburn: Secondary | ICD-10-CM | POA: Insufficient documentation

## 2013-02-01 DIAGNOSIS — C674 Malignant neoplasm of posterior wall of bladder: Secondary | ICD-10-CM | POA: Insufficient documentation

## 2013-02-01 DIAGNOSIS — N1370 Vesicoureteral-reflux, unspecified: Secondary | ICD-10-CM | POA: Insufficient documentation

## 2013-02-01 DIAGNOSIS — K219 Gastro-esophageal reflux disease without esophagitis: Secondary | ICD-10-CM | POA: Insufficient documentation

## 2013-02-01 DIAGNOSIS — R351 Nocturia: Secondary | ICD-10-CM | POA: Insufficient documentation

## 2013-02-01 DIAGNOSIS — K449 Diaphragmatic hernia without obstruction or gangrene: Secondary | ICD-10-CM | POA: Insufficient documentation

## 2013-02-01 DIAGNOSIS — C61 Malignant neoplasm of prostate: Secondary | ICD-10-CM | POA: Insufficient documentation

## 2013-02-01 DIAGNOSIS — N401 Enlarged prostate with lower urinary tract symptoms: Secondary | ICD-10-CM | POA: Insufficient documentation

## 2013-02-01 DIAGNOSIS — N138 Other obstructive and reflux uropathy: Secondary | ICD-10-CM | POA: Insufficient documentation

## 2013-02-01 DIAGNOSIS — E119 Type 2 diabetes mellitus without complications: Secondary | ICD-10-CM | POA: Insufficient documentation

## 2013-02-01 HISTORY — PX: TRANSURETHRAL RESECTION OF PROSTATE: SHX73

## 2013-02-01 HISTORY — DX: Hypothyroidism, unspecified: E03.9

## 2013-02-01 HISTORY — DX: Gastro-esophageal reflux disease without esophagitis: K21.9

## 2013-02-01 HISTORY — DX: Urgency of urination: R39.15

## 2013-02-01 HISTORY — DX: Chronic kidney disease, stage 3 (moderate): N18.3

## 2013-02-01 HISTORY — DX: Frequency of micturition: R35.0

## 2013-02-01 HISTORY — DX: Type 2 diabetes mellitus without complications: E11.9

## 2013-02-01 HISTORY — DX: Benign prostatic hyperplasia without lower urinary tract symptoms: N40.0

## 2013-02-01 HISTORY — DX: Personal history of other diseases of the digestive system: Z87.19

## 2013-02-01 HISTORY — DX: Nocturia: R35.1

## 2013-02-01 HISTORY — DX: Chronic kidney disease, stage 3 unspecified: N18.30

## 2013-02-01 HISTORY — DX: Occlusion and stenosis of right carotid artery: I65.21

## 2013-02-01 HISTORY — DX: Shortness of breath: R06.02

## 2013-02-01 HISTORY — DX: Personal history of peptic ulcer disease: Z87.11

## 2013-02-01 LAB — GLUCOSE, CAPILLARY
Glucose-Capillary: 142 mg/dL — ABNORMAL HIGH (ref 70–99)
Glucose-Capillary: 146 mg/dL — ABNORMAL HIGH (ref 70–99)
Glucose-Capillary: 84 mg/dL (ref 70–99)

## 2013-02-01 LAB — POCT I-STAT 4, (NA,K, GLUC, HGB,HCT)
Glucose, Bld: 149 mg/dL — ABNORMAL HIGH (ref 70–99)
Potassium: 4.5 mEq/L (ref 3.5–5.1)
Sodium: 142 mEq/L (ref 135–145)

## 2013-02-01 SURGERY — TRANSURETHRAL RESECTION OF THE PROSTATE WITH GYRUS INSTRUMENTS
Anesthesia: General | Site: Prostate | Wound class: Clean Contaminated

## 2013-02-01 MED ORDER — SODIUM CHLORIDE 0.9 % IV SOLN
INTRAVENOUS | Status: DC
  Administered 2013-02-01: 09:00:00 via INTRAVENOUS
  Filled 2013-02-01: qty 1000

## 2013-02-01 MED ORDER — METFORMIN HCL 500 MG PO TABS
1000.0000 mg | ORAL_TABLET | Freq: Two times a day (BID) | ORAL | Status: DC
  Administered 2013-02-01 – 2013-02-02 (×2): 1000 mg via ORAL
  Filled 2013-02-01: qty 2

## 2013-02-01 MED ORDER — LIDOCAINE HCL 2 % EX GEL
CUTANEOUS | Status: DC | PRN
  Administered 2013-02-01: 1 via URETHRAL

## 2013-02-01 MED ORDER — LIDOCAINE HCL (CARDIAC) 20 MG/ML IV SOLN
INTRAVENOUS | Status: DC | PRN
  Administered 2013-02-01: 50 mg via INTRAVENOUS

## 2013-02-01 MED ORDER — STERILE WATER FOR IRRIGATION IR SOLN
Status: DC | PRN
  Administered 2013-02-01: 30 mL

## 2013-02-01 MED ORDER — PROPOFOL 10 MG/ML IV BOLUS
INTRAVENOUS | Status: DC | PRN
  Administered 2013-02-01: 120 mg via INTRAVENOUS
  Administered 2013-02-01: 30 mg via INTRAVENOUS

## 2013-02-01 MED ORDER — BELLADONNA ALKALOIDS-OPIUM 16.2-60 MG RE SUPP
RECTAL | Status: DC | PRN
  Administered 2013-02-01: 1 via RECTAL

## 2013-02-01 MED ORDER — CIPROFLOXACIN IN D5W 400 MG/200ML IV SOLN
400.0000 mg | Freq: Two times a day (BID) | INTRAVENOUS | Status: DC
  Administered 2013-02-01: 400 mg via INTRAVENOUS
  Filled 2013-02-01: qty 200

## 2013-02-01 MED ORDER — PANTOPRAZOLE SODIUM 40 MG PO TBEC
40.0000 mg | DELAYED_RELEASE_TABLET | Freq: Every day | ORAL | Status: DC
  Administered 2013-02-01: 40 mg via ORAL
  Filled 2013-02-01: qty 1

## 2013-02-01 MED ORDER — PROMETHAZINE HCL 25 MG/ML IJ SOLN
6.2500 mg | INTRAMUSCULAR | Status: DC | PRN
  Filled 2013-02-01: qty 1

## 2013-02-01 MED ORDER — DOXAZOSIN MESYLATE 4 MG PO TABS
4.0000 mg | ORAL_TABLET | Freq: Every day | ORAL | Status: DC
  Administered 2013-02-01: 4 mg via ORAL
  Filled 2013-02-01: qty 1

## 2013-02-01 MED ORDER — INSULIN ASPART 100 UNIT/ML ~~LOC~~ SOLN
0.0000 [IU] | Freq: Three times a day (TID) | SUBCUTANEOUS | Status: DC
  Administered 2013-02-01 – 2013-02-02 (×2): 2 [IU] via SUBCUTANEOUS
  Filled 2013-02-01: qty 0.15

## 2013-02-01 MED ORDER — HYDROCODONE-ACETAMINOPHEN 5-325 MG PO TABS
1.0000 | ORAL_TABLET | ORAL | Status: DC | PRN
  Administered 2013-02-01: 1 via ORAL
  Filled 2013-02-01: qty 2

## 2013-02-01 MED ORDER — HYDROMORPHONE HCL PF 1 MG/ML IJ SOLN
0.2500 mg | INTRAMUSCULAR | Status: DC | PRN
  Filled 2013-02-01: qty 1

## 2013-02-01 MED ORDER — LACTATED RINGERS IV SOLN
INTRAVENOUS | Status: DC
  Filled 2013-02-01: qty 1000

## 2013-02-01 MED ORDER — CIPROFLOXACIN IN D5W 400 MG/200ML IV SOLN
400.0000 mg | INTRAVENOUS | Status: AC
  Administered 2013-02-01: 400 mg via INTRAVENOUS
  Filled 2013-02-01: qty 200

## 2013-02-01 MED ORDER — LEVOTHYROXINE SODIUM 50 MCG PO TABS
50.0000 ug | ORAL_TABLET | Freq: Every evening | ORAL | Status: DC
  Administered 2013-02-02: 50 ug via ORAL
  Filled 2013-02-01: qty 1

## 2013-02-01 MED ORDER — ACETAMINOPHEN 325 MG PO TABS
650.0000 mg | ORAL_TABLET | ORAL | Status: DC | PRN
  Filled 2013-02-01: qty 2

## 2013-02-01 MED ORDER — ONDANSETRON HCL 4 MG/2ML IJ SOLN
INTRAMUSCULAR | Status: DC | PRN
  Administered 2013-02-01: 4 mg via INTRAVENOUS

## 2013-02-01 MED ORDER — FENTANYL CITRATE 0.05 MG/ML IJ SOLN
INTRAMUSCULAR | Status: DC | PRN
  Administered 2013-02-01: 50 ug via INTRAVENOUS
  Administered 2013-02-01: 25 ug via INTRAVENOUS

## 2013-02-01 MED ORDER — INSULIN REGULAR HUMAN 100 UNIT/ML IJ SOLN
4.0000 [IU] | INTRAMUSCULAR | Status: DC
  Administered 2013-02-02: 4 [IU] via SUBCUTANEOUS
  Filled 2013-02-01: qty 0.04

## 2013-02-01 MED ORDER — GLIPIZIDE ER 10 MG PO TB24
10.0000 mg | ORAL_TABLET | Freq: Every day | ORAL | Status: DC
  Administered 2013-02-01 – 2013-02-02 (×2): 10 mg via ORAL
  Filled 2013-02-01: qty 1

## 2013-02-01 MED ORDER — OXYBUTYNIN CHLORIDE 5 MG PO TABS
5.0000 mg | ORAL_TABLET | Freq: Three times a day (TID) | ORAL | Status: DC | PRN
  Administered 2013-02-01: 5 mg via ORAL
  Filled 2013-02-01: qty 1

## 2013-02-01 MED ORDER — ONDANSETRON HCL 4 MG/2ML IJ SOLN
4.0000 mg | INTRAMUSCULAR | Status: DC | PRN
  Filled 2013-02-01: qty 2

## 2013-02-01 MED ORDER — KCL IN DEXTROSE-NACL 20-5-0.45 MEQ/L-%-% IV SOLN
INTRAVENOUS | Status: DC
  Administered 2013-02-01: 18:00:00 via INTRAVENOUS
  Filled 2013-02-01: qty 1000

## 2013-02-01 MED ORDER — SODIUM CHLORIDE 0.9 % IR SOLN
Status: DC | PRN
  Administered 2013-02-01: 15000 mL via INTRAVESICAL
  Administered 2013-02-01: 15:00:00

## 2013-02-01 MED ORDER — SIMVASTATIN 20 MG PO TABS
20.0000 mg | ORAL_TABLET | Freq: Every evening | ORAL | Status: DC
  Administered 2013-02-01: 20 mg via ORAL
  Filled 2013-02-01: qty 1

## 2013-02-01 SURGICAL SUPPLY — 30 items
BAG DRAIN URO-CYSTO SKYTR STRL (DRAIN) ×2 IMPLANT
BAG DRN ANRFLXCHMBR STRAP LEK (BAG)
BAG DRN UROCATH (DRAIN) ×1
BAG URINE DRAINAGE (UROLOGICAL SUPPLIES) ×1 IMPLANT
BAG URINE LEG 19OZ MD ST LTX (BAG) IMPLANT
CANISTER SUCT LVC 12 LTR MEDI- (MISCELLANEOUS) ×2 IMPLANT
CATH FOLEY 2WAY SLVR  5CC 20FR (CATHETERS)
CATH FOLEY 2WAY SLVR  5CC 22FR (CATHETERS)
CATH FOLEY 2WAY SLVR 5CC 20FR (CATHETERS) IMPLANT
CATH FOLEY 2WAY SLVR 5CC 22FR (CATHETERS) IMPLANT
CATH FOLEY 3WAY 30CC 22F (CATHETERS) ×1 IMPLANT
CLOTH BEACON ORANGE TIMEOUT ST (SAFETY) ×2 IMPLANT
DRAPE CAMERA CLOSED 9X96 (DRAPES) ×2 IMPLANT
ELECT BUTTON HF 24-28F 2 30DE (ELECTRODE) ×2 IMPLANT
ELECT LOOP MED HF 24F 12D CBL (CLIP) ×1 IMPLANT
ELECT REM PT RETURN 9FT ADLT (ELECTROSURGICAL) ×2
ELECT RESECT VAPORIZE 12D CBL (ELECTRODE) IMPLANT
ELECTRODE REM PT RTRN 9FT ADLT (ELECTROSURGICAL) ×1 IMPLANT
EVACUATOR MICROVAS BLADDER (UROLOGICAL SUPPLIES) IMPLANT
GLOVE BIO SURGEON STRL SZ7.5 (GLOVE) ×2 IMPLANT
GLOVE BIOGEL M 6.5 STRL (GLOVE) ×2 IMPLANT
GLOVE BIOGEL M STER SZ 6 (GLOVE) ×1 IMPLANT
GOWN STRL REIN XL XLG (GOWN DISPOSABLE) ×2 IMPLANT
HOLDER FOLEY CATH W/STRAP (MISCELLANEOUS) IMPLANT
IV NS IRRIG 3000ML ARTHROMATIC (IV SOLUTION) ×5 IMPLANT
KIT ASPIRATION TUBING (SET/KITS/TRAYS/PACK) ×1 IMPLANT
PACK CYSTOSCOPY (CUSTOM PROCEDURE TRAY) ×2 IMPLANT
PLUG CATH AND CAP STER (CATHETERS) IMPLANT
SET ASPIRATION TUBING (TUBING) IMPLANT
SYRINGE IRR TOOMEY STRL 70CC (SYRINGE) ×1 IMPLANT

## 2013-02-01 NOTE — Anesthesia Postprocedure Evaluation (Signed)
Anesthesia Post Note  Patient: Joshua [name]  Procedure(s) Performed: Procedure(s) (LRB): TRANSURETHRAL RESECTION OF THE PROSTATEAND TURB  WITH GYRUS INSTRUMENTS (N/A)  Anesthesia type: General  Patient location: PACU  Post pain: Pain level controlled  Post assessment: Post-op Vital signs reviewed  Last Vitals:  Filed Vitals:   02/01/13 1222  BP: 166/77  Pulse: 54  Temp: 36.3 C  Resp: 14    Post vital signs: Reviewed  Level of consciousness: sedated  Complications: No apparent anesthesia complications

## 2013-02-01 NOTE — Progress Notes (Signed)
Reviewed meds w pt and family.  Pt din't take synthroid this am or yesterday. Nor did he bring it with him. Stated "it's just for energy" pt encouraged to take meds as ordered to keep levels therapeutic. Daughter to get zocor and prilosec from home .

## 2013-02-01 NOTE — Anesthesia Preprocedure Evaluation (Addendum)
Anesthesia Evaluation  Patient identified by MRN, date of birth, ID band Patient awake    Reviewed: Allergy & Precautions, H&P , NPO status , Patient's Chart, lab work & pertinent test results  Airway Mallampati: II TM Distance: >3 FB Neck ROM: Full    Dental  (+) Teeth Intact and Dental Advisory Given   Pulmonary shortness of breath and with exertion,  breath sounds clear to auscultation  Pulmonary exam normal       Cardiovascular + Peripheral Vascular Disease Rhythm:Regular Rate:Normal     Neuro/Psych negative neurological ROS  negative psych ROS   GI/Hepatic Neg liver ROS, hiatal hernia, GERD-  ,  Endo/Other  diabetes, Type 2, Oral Hypoglycemic Agents and Insulin DependentHypothyroidism   Renal/GU Renal InsufficiencyRenal disease  negative genitourinary   Musculoskeletal negative musculoskeletal ROS (+)   Abdominal   Peds  Hematology negative hematology ROS (+)   Anesthesia Other Findings   Reproductive/Obstetrics                          Anesthesia Physical Anesthesia Plan  ASA: II  Anesthesia Plan: General   Post-op Pain Management:    Induction: Intravenous  Airway Management Planned: LMA  Additional Equipment:   Intra-op Plan:   Post-operative Plan: Extubation in OR  Informed Consent: I have reviewed the patients History and Physical, chart, labs and discussed the procedure including the risks, benefits and alternatives for the proposed anesthesia with the patient or authorized representative who has indicated his/her understanding and acceptance.   Dental advisory given  Plan Discussed with: CRNA  Anesthesia Plan Comments:         Anesthesia Quick Evaluation

## 2013-02-01 NOTE — Interval H&P Note (Signed)
History and Physical Interval Note:  02/01/2013 9:40 AM  Joshua [name]  has presented today for surgery, with the diagnosis of Benign Prostatic Hypertrophy  The various methods of treatment have been discussed with the patient and family. After consideration of risks, benefits and other options for treatment, the patient has consented to  Procedure(s): TRANSURETHRAL RESECTION OF THE PROSTATE WITH GYRUS INSTRUMENTS (N/A) as a surgical intervention .  The patient's history has been reviewed, patient examined, no change in status, stable for surgery.  I have reviewed the patient's chart and labs.  Questions were answered to the patient's satisfaction.     Hayk Divis S

## 2013-02-01 NOTE — Transfer of Care (Signed)
Immediate Anesthesia Transfer of Care Note  Patient: Joshua [name]  Procedure(s) Performed: Procedure(s): TRANSURETHRAL RESECTION OF THE PROSTATEAND TURB  WITH GYRUS INSTRUMENTS (N/A)  Patient Location: PACU  Anesthesia Type:General  Level of Consciousness: sedated  Airway & Oxygen Therapy: Patient Spontanous Breathing and Patient connected to nasal cannula oxygen  Post-op Assessment: Report given to PACU RN  Post vital signs: Reviewed and stable  Complications: No apparent anesthesia complications

## 2013-02-01 NOTE — Op Note (Signed)
Preoperative diagnosis: BPH Postoperative diagnosis: Same, transitional cell carcinoma the bladder (unexpected)  Procedure: Gyrus TURP, TURBT of small bladder tumor Surgeon: Valetta Fuller M.D.  Anesthesia: Gen.  Indications: Mr. Dunlevy is had long-standing and progressive voiding symptoms. He has been on medical therapy but has continued to have progressive symptoms. Urodynamics including pressure flow studies were suggestive of significant outlet obstruction. He presents now for definitive management.     Technique and findings: Patient was brought the operating room where he had successful induction general anesthesia. He was placed in lithotomy position prepped and draped in usual manner. The patient had PAS compression boots and received perioperative antibiotics. Appropriate surgical timeout was performed. 20 French continuous flow resectoscope was utilized with gyrus instrumentation and saline irrigation. When we inspected the bladder I saw on expected small papillary appearing tumor on the posterior wall of the bladder. This appeared to be approximately 1-2 cm in size. It appeared to be low-grade and probably superficial. The middle lobe/median bar the prostate was taken down to capsular fibers. We then resected the lateral labs from anterior to posterior bilaterally out to the vera montanum. Prostatic chips were sent for pathologic analysis. At the completion of the prosthetic resection and we went ahead and did a TUR resection of the posterior bladder tumor. This tissue was sent separately. Hemostasis was good. A 22 French three-way continuous bladder irrigation catheter was placed. Saline irrigation will be utilized. The patient no obvious complications and was brought to recovery room stable condition.

## 2013-02-01 NOTE — Anesthesia Procedure Notes (Signed)
Procedure Name: LMA Insertion Date/Time: 02/01/2013 10:14 AM Performed by: Maris Berger T Pre-anesthesia Checklist: Patient identified, Emergency Drugs available, Suction available and Patient being monitored Patient Re-evaluated:Patient Re-evaluated prior to inductionOxygen Delivery Method: Circle System Utilized Preoxygenation: Pre-oxygenation with 100% oxygen Intubation Type: IV induction Ventilation: Mask ventilation without difficulty LMA: LMA inserted LMA Size: 5.0 Number of attempts: 1 Airway Equipment and Method: bite block Placement Confirmation: positive ETCO2 Dental Injury: Teeth and Oropharynx as per pre-operative assessment

## 2013-02-02 ENCOUNTER — Encounter (HOSPITAL_BASED_OUTPATIENT_CLINIC_OR_DEPARTMENT_OTHER): Payer: Self-pay | Admitting: Urology

## 2013-02-02 LAB — BASIC METABOLIC PANEL
CO2: 27 mEq/L (ref 19–32)
Chloride: 106 mEq/L (ref 96–112)
GFR calc Af Amer: 47 mL/min — ABNORMAL LOW (ref >90)
GFR calc non Af Amer: 41 mL/min — ABNORMAL LOW (ref >90)
Potassium: 4.4 mEq/L (ref 3.5–5.1)
Sodium: 139 mEq/L (ref 135–145)

## 2013-02-02 LAB — HEMOGLOBIN AND HEMATOCRIT, BLOOD
HCT: 28 % — ABNORMAL LOW (ref 39.0–52.0)
Hemoglobin: 9 g/dL — ABNORMAL LOW (ref 13.0–17.0)

## 2013-02-02 LAB — GLUCOSE, CAPILLARY: Glucose-Capillary: 122 mg/dL — ABNORMAL HIGH (ref 70–99)

## 2013-02-02 MED ORDER — HYDROCODONE-ACETAMINOPHEN 5-325 MG PO TABS
1.0000 | ORAL_TABLET | ORAL | Status: DC | PRN
Start: 2013-02-02 — End: 2013-07-30

## 2013-02-02 MED ORDER — CIPROFLOXACIN HCL 250 MG PO TABS: 250.0000 mg | ORAL_TABLET | Freq: Two times a day (BID) | ORAL | Status: DC

## 2013-02-02 NOTE — Discharge Summary (Signed)
Patient ID: Joshua [name] MRN: [id number] DOB/AGE: 08-27-1928 79 y.o.  Admit date: 02/01/2013 Discharge date: 02/02/2013    Discharge Diagnoses:   Present on Admission:  **None**  Consults:  None    Discharge Medications:   Medication List         alfuzosin 10 MG 24 hr tablet  Commonly known as:  UROXATRAL  Take 10 mg by mouth daily.     ciprofloxacin 250 MG tablet  Commonly known as:  CIPRO  Take 1 tablet (250 mg total) by mouth 2 (two) times daily.     doxazosin 4 MG tablet  Commonly known as:  CARDURA  Take 4 mg by mouth at bedtime.     glipiZIDE 10 MG 24 hr tablet  Commonly known as:  GLUCOTROL XL  Take 10 mg by mouth daily.     HYDROcodone-acetaminophen 5-325 MG per tablet  Commonly known as:  NORCO/VICODIN  Take 1-2 tablets by mouth every 4 (four) hours as needed.     insulin regular 100 units/mL injection  Commonly known as:  NOVOLIN R,HUMULIN R  Inject 4 Units into the skin 1 day or 1 dose.     levothyroxine 50 MCG tablet  Commonly known as:  SYNTHROID, LEVOTHROID  Take 50 mcg by mouth every evening.     metFORMIN 1000 MG tablet  Commonly known as:  GLUCOPHAGE  Take 1,000 mg by mouth 2 (two) times daily with a meal.     omeprazole 20 MG capsule  Commonly known as:  PRILOSEC  Take 20 mg by mouth as needed.     simvastatin 40 MG tablet  Commonly known as:  ZOCOR  Take 20 mg by mouth every evening.     sitaGLIPtin 100 MG tablet  Commonly known as:  JANUVIA  Take 100 mg by mouth daily.         Significant Diagnostic Studies:  No results found.    Hospital Course:  Active Problems:   * No active hospital problems. *  Patient underwent elective TURP for ongoing voiding symptoms secondary to BPH. At the time of the procedure he was noted to have a incidentally found small tumor on the posterior wall of his bladder consistent with a small transitional cell carcinoma. That was resected as well. The patient had no obvious  interoperative complications. He was kept overnight for observation. He had no obvious problems or issues. Lab values were acceptable. Urine was a very light pink at the time of discharge home. Discharged home in good condition. Day of Discharge BP 146/74  Pulse 61  Temp(Src) 97.5 F (36.4 C) (Oral)  Resp 16  Ht 5\' 7"  (1.702 m)  Wt 89.585 kg (197 lb 8 oz)  BMI 30.93 kg/m2  SpO2 94% Awake and alert. Abdomen soft and nontender. Respiratory effort normal. Cardiac regular rate and rhythm. Extremities without edema. Genitourinary exam shows an indwelling Foley catheter draining pink urine.  Results for orders placed during the hospital encounter of 02/01/13 (from the past 24 hour(s))  POCT I-STAT 4, (NA,K, GLUC, HGB,HCT)     Status: Abnormal   Collection Time    02/01/13  9:42 AM      Result Value Range   Sodium 142  135 - 145 mEq/L   Potassium 4.5  3.5 - 5.1 mEq/L   Glucose, Bld 149 (*) 70 - 99 mg/dL   HCT 14.7 (*) 82.9 - 56.2 %   Hemoglobin 10.9 (*) 13.0 - 17.0 g/dL  GLUCOSE, CAPILLARY  Status: Abnormal   Collection Time    02/01/13 11:26 AM      Result Value Range   Glucose-Capillary 142 (*) 70 - 99 mg/dL  GLUCOSE, CAPILLARY     Status: Abnormal   Collection Time    02/01/13  5:03 PM      Result Value Range   Glucose-Capillary 146 (*) 70 - 99 mg/dL   Comment 1 Documented in Chart     Comment 2 Notify RN    GLUCOSE, CAPILLARY     Status: None   Collection Time    02/01/13  9:55 PM      Result Value Range   Glucose-Capillary 84  70 - 99 mg/dL   Comment 1 Documented in Chart     Comment 2 Notify RN    HEMOGLOBIN AND HEMATOCRIT, BLOOD     Status: Abnormal   Collection Time    02/02/13  6:04 AM      Result Value Range   Hemoglobin 9.0 (*) 13.0 - 17.0 g/dL   HCT 16.1 (*) 09.6 - 04.5 %  BASIC METABOLIC PANEL     Status: Abnormal   Collection Time    02/02/13  6:04 AM      Result Value Range   Sodium 139  135 - 145 mEq/L   Potassium 4.4  3.5 - 5.1 mEq/L   Chloride 106   96 - 112 mEq/L   CO2 27  19 - 32 mEq/L   Glucose, Bld 140 (*) 70 - 99 mg/dL   BUN 19  6 - 23 mg/dL   Creatinine, Ser 4.09 (*) 0.50 - 1.35 mg/dL   Calcium 8.2 (*) 8.4 - 10.5 mg/dL   GFR calc non Af Amer 41 (*) >90 mL/min   GFR calc Af Amer 47 (*) >90 mL/min  GLUCOSE, CAPILLARY     Status: Abnormal   Collection Time    02/02/13  6:54 AM      Result Value Range   Glucose-Capillary 122 (*) 70 - 99 mg/dL

## 2013-02-18 ENCOUNTER — Other Ambulatory Visit: Payer: Self-pay

## 2013-06-22 ENCOUNTER — Other Ambulatory Visit: Payer: Self-pay | Admitting: Family Medicine

## 2013-06-22 ENCOUNTER — Ambulatory Visit
Admission: RE | Admit: 2013-06-22 | Discharge: 2013-06-22 | Disposition: A | Discharge status: Home / Self Care | Payer: Medicare HMO | Source: Ambulatory Visit | Attending: Family Medicine | Admitting: Family Medicine

## 2013-06-22 DIAGNOSIS — R52 Pain, unspecified: Secondary | ICD-10-CM

## 2013-06-22 DIAGNOSIS — W19XXXA Unspecified fall, initial encounter: Secondary | ICD-10-CM

## 2013-07-30 ENCOUNTER — Ambulatory Visit (INDEPENDENT_AMBULATORY_CARE_PROVIDER_SITE_OTHER): Payer: Medicare HMO | Admitting: Neurology

## 2013-07-30 ENCOUNTER — Encounter: Payer: Self-pay | Admitting: Neurology

## 2013-07-30 VITALS — BP 156/88 | HR 60 | Resp 16 | Ht 68.0 in | Wt 199.0 lb

## 2013-07-30 DIAGNOSIS — G214 Vascular parkinsonism: Secondary | ICD-10-CM

## 2013-07-30 DIAGNOSIS — G20 Parkinson's disease: Secondary | ICD-10-CM

## 2013-07-30 DIAGNOSIS — E1142 Type 2 diabetes mellitus with diabetic polyneuropathy: Secondary | ICD-10-CM

## 2013-07-30 DIAGNOSIS — F0150 Vascular dementia without behavioral disturbance: Secondary | ICD-10-CM

## 2013-07-30 DIAGNOSIS — E1149 Type 2 diabetes mellitus with other diabetic neurological complication: Secondary | ICD-10-CM

## 2013-07-30 MED ORDER — DONEPEZIL HCL 5 MG PO TABS: 5.0000 mg | ORAL_TABLET | Freq: Every day | ORAL | Status: DC

## 2013-07-30 MED ORDER — DONEPEZIL HCL 10 MG PO TABS: 10.0000 mg | ORAL_TABLET | Freq: Every day | ORAL | Status: DC

## 2013-07-30 NOTE — Patient Instructions (Signed)
1. You have been referred to [facility]. They will call you directly to schedule an appointment.  Please call [phone] if you do not hear from them.  2. Please take 1000 mcg Vitamin B12 daily, you can get this over the counter.  3. If you are interested in the driving assessment, you can contact [facility] in [place]. [phone]. No driving until after evaluation.  4. EXERCISE! 5. Start Aricept. We have sent two prescriptions to your pharmacy. Start on 5 mg for one month, then increase to 10 mg daily.  6. Follow up 6 months.

## 2013-07-30 NOTE — Progress Notes (Signed)
Joshua [name] was seen today in the movement disorders clinic for neurologic consultation at the request of Thora Lance, MD.  The consultation is for the evaluation of gait and memory changes.  Pt is accompanied by his daughter who is his POA and wife, who supplement the history.  His daughter brought in several notes for me to review prior to his visit.  Her biggest concern is the patient's memory loss and that started about 1 year ago and that got worse over the last year.  The memory seems to fluctuate.  The patients wife has Parkinson's disease and they are the only 2 in the home.  However, the patient is not able to assist much with the caregiving of his wife, who is still very functional.  He will get dressed and then forget that he has gotten dressed and try to put on another pair of pants on top of the first pair that he just put on.  Recently, he got his shaving cream mixed up with soap and sprayed shaving cream all over the bathroom.  He is still driving, and he will forget where to go.  He admits that he "messes up even in familiar places."   The pt has lived in the same home with his wife for 53 years.  They live in a one story home.  His wife has always taken care of finances.  He takes care of distributing his own medications but last week they got him his own pill box that his wife is preparing.  She wasn't sure that the medications were getting taken properly.  There are no word finding troubles.  No appetite troubles.  Pt admits that he sleeps most of the day.  Until December, he was working and since then, he really hasn't gotten into any type of routine.  There are no hallucinations.    Specific Symptoms:  Tremor: no Voice: no changes Sleep: sleeps well  Vivid Dreams:  yes  Acting out dreams:  no Wet Pillows: no Postural symptoms:  yes  Falls?  no Bradykinesia symptoms: slow movements and difficulty getting out of a chair Loss of smell:  no Loss of taste:  no Urinary  Incontinence:  no Difficulty Swallowing:  no Handwriting, micrographia: no Trouble with ADL's:  no  Trouble buttoning clothing: no Depression:  no Memory changes:  no Hallucinations:  no  visual distortions: no N/V:  no Lightheaded:  yes  Syncope: no Diplopia:  no Dyskinesia:  no  Neuroimaging has  previously been performed.  It is available for my review today.  A CT of the brain was performed in 2012 demonstrating atrophy  PREVIOUS MEDICATIONS: none to date  ALLERGIES:  No Known Allergies  CURRENT MEDICATIONS:  Current Outpatient Prescriptions on File Prior to Visit  Medication Sig Dispense Refill  . alfuzosin (UROXATRAL) 10 MG 24 hr tablet Take 10 mg by mouth daily.      . ciprofloxacin (CIPRO) 250 MG tablet Take 1 tablet (250 mg total) by mouth 2 (two) times daily.  10 tablet  0  . doxazosin (CARDURA) 4 MG tablet Take 4 mg by mouth at bedtime.      Marland Kitchen glipiZIDE (GLUCOTROL XL) 10 MG 24 hr tablet Take 10 mg by mouth daily.      Marland Kitchen HYDROcodone-acetaminophen (NORCO/VICODIN) 5-325 MG per tablet Take 1-2 tablets by mouth every 4 (four) hours as needed.  30 tablet  0  . insulin regular (NOVOLIN R,HUMULIN R) 100 units/mL injection  Inject 4 Units into the skin 1 day or 1 dose.      . levothyroxine (SYNTHROID, LEVOTHROID) 50 MCG tablet Take 50 mcg by mouth every evening.      . metFORMIN (GLUCOPHAGE) 1000 MG tablet Take 1,000 mg by mouth 2 (two) times daily with a meal.      . omeprazole (PRILOSEC) 20 MG capsule Take 20 mg by mouth as needed.       . simvastatin (ZOCOR) 40 MG tablet Take 20 mg by mouth every evening.      . sitaGLIPtin (JANUVIA) 100 MG tablet Take 100 mg by mouth daily.       No current facility-administered medications on file prior to visit.    PAST MEDICAL HISTORY:   Past Medical History  Diagnosis Date  . Hyperlipidemia   . CKD (chronic kidney disease), stage III   . Type 2 diabetes mellitus   . BPH (benign prostatic hypertrophy)   . Hypothyroidism   . GERD  (gastroesophageal reflux disease)   . H/O hiatal hernia   . Asymptomatic stenosis of right carotid artery     MILD RIGHT ICA  <40%  PER DUPLEX STUDY  2003  . Short of breath on exertion   . History of gastric ulcer   . Frequency of urination   . Nocturia   . Urgency of urination     PAST SURGICAL HISTORY:   Past Surgical History  Procedure Laterality Date  . Cardiac catheterization  01-07-2000  DR Daneen Schick    ESSENTIALLY NORMAL CORONARY ARTERIES W/ MINIMAL PLAQUING RCA & LAD/ NORMAL LVF/ SEVERE TORTUOSITY AORTOILIAC AND FEMORAL TERRITORY / FALSE ABNORMAL CARDIOLITE  . Total knee arthroplasty Bilateral LEFT  10-04-2004/   RIGHT  03-17-2008  . Lumbar disc surgery  1994  . Transurethral resection of prostate N/A 02/01/2013    Procedure: TRANSURETHRAL RESECTION OF THE PROSTATEAND TURB  WITH GYRUS INSTRUMENTS;  Surgeon: Bernestine Amass, MD;  Location: [facility];  Service: Urology;  Laterality: N/A;    SOCIAL HISTORY:   History   Social History  . Marital Status: Married    Spouse Name: N/A    Number of Children: N/A  . Years of Education: N/A   Occupational History  . Not on file.   Social History Main Topics  . Smoking status: Never Smoker   . Smokeless tobacco: Never Used  . Alcohol Use: No  . Drug Use: No  . Sexual Activity: Not on file   Other Topics Concern  . Not on file   Social History Narrative  . No narrative on file    FAMILY HISTORY:   Family Status  Relation Status Death Age  . Mother Deceased   . Father Deceased   . Sister Alive   . Brother Alive   . Daughter Alive   . Son Alive   . Maternal Grandmother Deceased   . Maternal Grandfather Deceased   . Paternal Grandmother Deceased   . Paternal Grandfather Deceased   . Brother Alive   . Brother Alive   . Brother Deceased     ROS:  A complete 10 system review of systems was obtained and was unremarkable apart from what is mentioned above.  PHYSICAL EXAMINATION:    VITALS:     Filed Vitals:   07/30/13 1025  BP: 156/88  Pulse: 60  Resp: 16  Height: 5\' 8"  (1.727 m)  Weight: 199 lb (90.266 kg)    GEN:  The patient appears  stated age and is in NAD. HEENT:  Normocephalic, atraumatic.  The mucous membranes are moist. The superficial temporal arteries are without ropiness or tenderness. CV:  RRR Lungs:  CTAB Neck/HEME:  There are no carotid bruits bilaterally.  Neurological examination:  Orientation: MoCA was performed on 07/30/13 and the pt scored 14/30.  Performed poorly on trail making, clock drawing, cube copying and delayed word reclall.  Able to name month, year, day but misses the date. Sensation: Sensation is intact to light and pinprick throughout (facial, trunk, extremities). Vibration is decreased at the bilateral big toe. There is no extinction with double simultaneous stimulation. There is no sensory dermatomal level identified. Motor: Strength is 5/5 in the bilateral upper and lower extremities.   Shoulder shrug is equal and symmetric.  There is no pronator drift. Deep tendon reflexes: Deep tendon reflexes are 2/4 at the bilateral biceps, triceps, brachioradialis, patella and 1/4 at the bilateral achilles. Plantar responses are downgoing bilaterally. Frontal release signs: There is a bilateral palmomental response.  There is a positive glabellar tap (Myerson sign).  No rooting reflex.  Movement examination: Tone: There is no true increased tone in the upper extremities.  There was gegenhalten present that did dissipate with distraction procedures.  Tone in the lower extremities was normal.   Abnormal movements: There was a mild tremor of the outstretched hands Coordination:  There is no significant decremation with RAM's Gait and Station: The patient has minimal difficulty arising out of a deep-seated chair without the use of the hands.  He was able to arise without the use of the hands on the second attempt.  The patient's stride length is decreased,  with a wide-based gait.  He does shuffle.    LABS  I did review labs that were recently done to his primary care physician.  On 07/13/2009 15 and his TSH was elevated at 5.16 and his thyroid medication was just recently adjusted.  His liver function testing was normal with an AST of 14 and ALT of 9.  His B12 in December, 2014 was slightly low at 228 and in April 2014 was even lower at 170.  His hemoglobin A1c in March, 2015 was 7.6 and his creatinine was elevated at 2.2 (his baseline has been lower than this, ranging from 1.6-1.9).    ASSESSMENT/PLAN:  1.  Memory loss  -I do think that this is multifactorial, but think the primary etiology is vascular dementia.  His TSH is somewhat elevated and this is being corrected.  He does have chronic kidney disease and his creatinine is even elevated above baseline.  His B12 is low and I asked him to take a B12 supplement, 1000 mcg daily and I will recheck this.  If it is not better, I will go ahead and start injections.  -We talked extensively about safety.  Greater than 50% of this 60 minute visit was spent in counseling discussing safety in the home, as well as out of the home.  We talked about the importance of medication monitoring.  We talked about driving.  I do not want him to drive, unless he passes an occupational therapy driving evaluation.  Right now, he was not willing to do that, so I told him that I did not want him to drive.  His daughter is going to talk to him more about this.  I gave them information on this.  We talked about both the importance of physical and mental exercises.  -He really wanted to try some  medication, and ultimately decided on Aricept.  They understand that this is indicated for Alzheimer's dementia, and I think that he has vascular dementia.  Nonetheless, they wanted to try this.  He will start with 5 mg for one month and then increase to 10 mg daily.  Risks, benefits, side effects and alternative therapies were discussed.   The opportunity to ask questions was given and they were answered to the best of my ability.  The patient expressed understanding and willingness to follow the outlined treatment protocols. 2.  gait changes.  -I do think he has evidence of vascular parkinsonism.  I do not think he has idiopathic Parkinson's disease.  We talked about the differences.  I do think he would benefit from therapy and I will refer him for this.  I do not think he would benefit from medication at this time.  I do not see anything focal or lateralizing on his examination today.  I do think that diabetic PN plays a role in gait stability as well. 3.  F/u 6 months, sooner should new neuro issues arise.

## 2013-08-10 ENCOUNTER — Ambulatory Visit: Payer: Medicare HMO | Attending: Neurology | Admitting: Physical Therapy

## 2013-08-10 DIAGNOSIS — R269 Unspecified abnormalities of gait and mobility: Secondary | ICD-10-CM | POA: Insufficient documentation

## 2013-08-10 DIAGNOSIS — G20 Parkinson's disease: Secondary | ICD-10-CM | POA: Insufficient documentation

## 2013-08-10 DIAGNOSIS — G20A1 Parkinson's disease without dyskinesia, without mention of fluctuations: Secondary | ICD-10-CM | POA: Insufficient documentation

## 2013-08-10 DIAGNOSIS — IMO0001 Reserved for inherently not codable concepts without codable children: Secondary | ICD-10-CM | POA: Insufficient documentation

## 2013-08-11 ENCOUNTER — Ambulatory Visit: Payer: Medicare HMO

## 2013-08-18 ENCOUNTER — Ambulatory Visit: Payer: Medicare HMO | Attending: Neurology

## 2013-08-18 DIAGNOSIS — G20 Parkinson's disease: Secondary | ICD-10-CM | POA: Insufficient documentation

## 2013-08-18 DIAGNOSIS — IMO0001 Reserved for inherently not codable concepts without codable children: Secondary | ICD-10-CM | POA: Insufficient documentation

## 2013-08-18 DIAGNOSIS — R269 Unspecified abnormalities of gait and mobility: Secondary | ICD-10-CM | POA: Diagnosis not present

## 2013-08-18 DIAGNOSIS — G20A1 Parkinson's disease without dyskinesia, without mention of fluctuations: Secondary | ICD-10-CM | POA: Insufficient documentation

## 2013-08-20 ENCOUNTER — Ambulatory Visit: Payer: Medicare HMO | Admitting: Physical Therapy

## 2013-08-20 DIAGNOSIS — IMO0001 Reserved for inherently not codable concepts without codable children: Secondary | ICD-10-CM | POA: Diagnosis not present

## 2013-08-24 ENCOUNTER — Ambulatory Visit: Payer: Medicare HMO

## 2013-08-24 DIAGNOSIS — IMO0001 Reserved for inherently not codable concepts without codable children: Secondary | ICD-10-CM | POA: Diagnosis not present

## 2013-08-27 ENCOUNTER — Ambulatory Visit: Payer: Medicare HMO

## 2013-08-27 DIAGNOSIS — IMO0001 Reserved for inherently not codable concepts without codable children: Secondary | ICD-10-CM | POA: Diagnosis not present

## 2013-08-30 ENCOUNTER — Other Ambulatory Visit: Payer: Self-pay | Admitting: Neurology

## 2013-09-01 ENCOUNTER — Ambulatory Visit: Payer: Medicare HMO | Admitting: Physical Therapy

## 2013-09-01 DIAGNOSIS — IMO0001 Reserved for inherently not codable concepts without codable children: Secondary | ICD-10-CM | POA: Diagnosis not present

## 2013-09-03 ENCOUNTER — Ambulatory Visit: Payer: Medicare HMO

## 2013-09-03 DIAGNOSIS — IMO0001 Reserved for inherently not codable concepts without codable children: Secondary | ICD-10-CM | POA: Diagnosis not present

## 2013-09-08 ENCOUNTER — Ambulatory Visit: Payer: Medicare HMO

## 2013-09-08 DIAGNOSIS — IMO0001 Reserved for inherently not codable concepts without codable children: Secondary | ICD-10-CM | POA: Diagnosis not present

## 2013-09-10 ENCOUNTER — Ambulatory Visit: Payer: Medicare HMO

## 2013-09-10 DIAGNOSIS — IMO0001 Reserved for inherently not codable concepts without codable children: Secondary | ICD-10-CM | POA: Diagnosis not present

## 2014-01-31 ENCOUNTER — Ambulatory Visit (INDEPENDENT_AMBULATORY_CARE_PROVIDER_SITE_OTHER): Payer: Medicare HMO | Admitting: Neurology

## 2014-01-31 ENCOUNTER — Encounter: Payer: Self-pay | Admitting: Neurology

## 2014-01-31 VITALS — BP 106/64 | HR 48 | Ht 68.0 in | Wt 194.0 lb

## 2014-01-31 DIAGNOSIS — F0150 Vascular dementia without behavioral disturbance: Secondary | ICD-10-CM

## 2014-01-31 DIAGNOSIS — G214 Vascular parkinsonism: Secondary | ICD-10-CM

## 2014-01-31 DIAGNOSIS — E538 Deficiency of other specified B group vitamins: Secondary | ICD-10-CM

## 2014-01-31 LAB — VITAMIN B12: Vitamin B-12: 485 pg/mL (ref 211–911)

## 2014-01-31 NOTE — Progress Notes (Signed)
Joshua [name] was seen today in the movement disorders clinic for neurologic consultation at the request of Joshua Huh, MD.  The consultation is for the evaluation of gait and memory changes.  Pt is accompanied by his daughter who is his POA and wife, who supplement the history.  His daughter brought in several notes for me to review prior to his visit.  Her biggest concern is the patient's memory loss and that started about 1 year ago and that got worse over the last year.  The memory seems to fluctuate.  The patients wife has Parkinson's disease and they are the only 2 in the home.  However, the patient is not able to assist much with the caregiving of his wife, who is still very functional.  He will get dressed and then forget that he has gotten dressed and try to put on another pair of pants on top of the first pair that he just put on.  Recently, he got his shaving cream mixed up with soap and sprayed shaving cream all over the bathroom.  He is still driving, and he will forget where to go.  He admits that he "messes up even in familiar places."   The pt has lived in the same home with his wife for 94 years.  They live in a one story home.  His wife has always taken care of finances.  He takes care of distributing his own medications but last week they got him his own pill box that his wife is preparing.  She wasn't sure that the medications were getting taken properly.  There are no word finding troubles.  No appetite troubles.  Pt admits that he sleeps most of the day.  Until December, he was working and since then, he really hasn't gotten into any type of routine.  There are no hallucinations.    01/31/14 update:  Pt is f/u today.  No one accompanies him back to the room.  I started the patient on aricept last visit.  The patient reports that he is doing well on this medication.  The patient did have his thyroid rechecked by his primary care physician since last visit as his TSH was  elevated at 5.16 last visit, but after treatment it had improved and TSH in June, 2015 was now 0.91.  However, his hemoglobin A1c in June was 9.2 and this was repeated on 12/29/2013 and was 9.3.  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  I set him up with physical therapy.  His B12 has been a little low and he admits that he only takes the oral supplement about 2-3 times per week.    Neuroimaging has  previously been performed.  It is available for my review today.  A CT of the brain was performed in 2012 demonstrating atrophy  PREVIOUS MEDICATIONS: none to date  ALLERGIES:  No Known Allergies  CURRENT MEDICATIONS:  Current Outpatient Prescriptions on File Prior to Visit  Medication Sig Dispense Refill  . donepezil (ARICEPT) 10 MG tablet Take 1 tablet (10 mg total) by mouth at bedtime.  90 tablet  3  . insulin regular (NOVOLIN R,HUMULIN R) 100 units/mL injection Inject 4 Units into the skin 1 day or 1 dose.      . levothyroxine (SYNTHROID, LEVOTHROID) 50 MCG tablet Take 50 mcg by mouth every evening.      . simvastatin (ZOCOR)  40 MG tablet Take 20 mg by mouth every evening.      . sitaGLIPtin (JANUVIA) 100 MG tablet Take 100 mg by mouth daily.       No current facility-administered medications on file prior to visit.    PAST MEDICAL HISTORY:   Past Medical History  Diagnosis Date  . Hyperlipidemia   . CKD (chronic kidney disease), stage III   . Type 2 diabetes mellitus   . BPH (benign prostatic hypertrophy)   . Hypothyroidism   . GERD (gastroesophageal reflux disease)   . H/O hiatal hernia   . Asymptomatic stenosis of right carotid artery     MILD RIGHT ICA  <40%  PER DUPLEX STUDY  2003  . Short of breath on exertion   . History of gastric ulcer   . Frequency of urination   . Nocturia   . Urgency of urination     PAST SURGICAL HISTORY:   Past Surgical History  Procedure Laterality Date  .  Cardiac catheterization  01-07-2000  DR Daneen Schick    ESSENTIALLY NORMAL CORONARY ARTERIES W/ MINIMAL PLAQUING RCA & LAD/ NORMAL LVF/ SEVERE TORTUOSITY AORTOILIAC AND FEMORAL TERRITORY / FALSE ABNORMAL CARDIOLITE  . Total knee arthroplasty Bilateral LEFT  10-04-2004/   RIGHT  03-17-2008  . Lumbar disc surgery  1994  . Transurethral resection of prostate N/A 02/01/2013    Procedure: TRANSURETHRAL RESECTION OF THE PROSTATEAND TURB  WITH GYRUS INSTRUMENTS;  Surgeon: Bernestine Amass, MD;  Location: [facility];  Service: Urology;  Laterality: N/A;    SOCIAL HISTORY:   History   Social History  . Marital Status: Married    Spouse Name: N/A    Number of Children: N/A  . Years of Education: N/A   Occupational History  . Not on file.   Social History Main Topics  . Smoking status: Never Smoker   . Smokeless tobacco: Never Used  . Alcohol Use: No  . Drug Use: No  . Sexual Activity: Not on file   Other Topics Concern  . Not on file   Social History Narrative  . No narrative on file    FAMILY HISTORY:   Family Status  Relation Status Death Age  . Mother Deceased     diabetes  . Father Deceased     heart disease, diabetes  . Sister Alive   . Brother Alive   . Daughter Alive   . Son Alive   . Maternal Grandmother Deceased   . Maternal Grandfather Deceased   . Paternal Grandmother Deceased   . Paternal Grandfather Deceased   . Brother Alive   . Brother Alive   . Brother Deceased     heart disease    ROS:  A complete 10 system review of systems was obtained and was unremarkable apart from what is mentioned above.  PHYSICAL EXAMINATION:    VITALS:   Filed Vitals:   01/31/14 0942  BP: 106/64  Pulse: 48  Height: 5\' 8"  (1.727 m)  Weight: 194 lb (87.998 kg)    GEN:  The patient appears stated age and is in NAD. HEENT:  Normocephalic, atraumatic.  The mucous membranes are moist. The superficial temporal arteries are without ropiness or tenderness. CV:   RRR Lungs:  CTAB Neck/HEME:  There are no carotid bruits bilaterally.  Neurological examination:  Orientation: [name] that today was 01/04/14 (it is 01/31/14).  Scores 2/4 on clock drawing. Sensation: Sensation is intact to light and pinprick throughout (facial, trunk,  extremities). Vibration is decreased at the bilateral big toe. There is no extinction with double simultaneous stimulation. There is no sensory dermatomal level identified. Motor: Strength is 5/5 in the bilateral upper and lower extremities.   Shoulder shrug is equal and symmetric.  There is no pronator drift. Deep tendon reflexes: Deep tendon reflexes are 2/4 at the bilateral biceps, triceps, brachioradialis, patella and 1/4 at the bilateral achilles. Plantar responses are downgoing bilaterally. Frontal release signs: There is a bilateral palmomental response.  There is a positive glabellar tap (Myerson sign).  No rooting reflex.  Movement examination: Tone: There is no increased tone in the upper extremities.  T Abnormal movements: There was a mild tremor of the outstretched hands Coordination:  There is no significant decremation with RAM's Gait and Station: The patient has minimal difficulty arising out of a deep-seated chair without the use of the hands.  He was able to arise without the use of the hands on the second attempt.  The patient's stride length is decreased, with a wide-based gait.  He is not shuffling today.  ASSESSMENT/PLAN:  1.  Memory loss  -I do think that this is multifactorial, but think the primary etiology is vascular dementia.  He is doing well on aricept and he can continue that.  I told him he can try it in the AM as he thinks that it is giving him bad dreams.  -We talked again about safety. We talked about the importance of medication monitoring.  We talked about driving.  I do not want him to drive, unless he passes an occupational therapy driving evaluation.  Right now, he was not willing to do that,  so I told him that I did not want him to drive.   2.  gait changes.  -I do think he has evidence of vascular parkinsonism.  I do not think he has idiopathic Parkinson's disease.  We talked about the differences.  He has just finished physical therapy. 3.  B12 deficiency  -taking oral supplements as he remembers.  Will recheck b12 today. 4.  F/u 6 months, sooner should new neuro issues arise.

## 2014-01-31 NOTE — Patient Instructions (Signed)
1. Remember to take Vitamin B12 - 1000 mcg every day.  2. You can move your Aricept dose to the morning.  3. Your provider has requested that you have labwork completed today. Please go to [facility] on the first floor of this building before leaving the office today.

## 2014-02-15 ENCOUNTER — Telehealth: Payer: Self-pay | Admitting: Neurology

## 2014-02-15 NOTE — Telephone Encounter (Signed)
Patient made aware b12 level okay.

## 2014-02-15 NOTE — Telephone Encounter (Signed)
Call w/ lab results, confirmed CB# [phone] / Sherri S.

## 2014-03-17 ENCOUNTER — Other Ambulatory Visit: Payer: Self-pay | Admitting: Family Medicine

## 2014-03-17 DIAGNOSIS — R1013 Epigastric pain: Secondary | ICD-10-CM

## 2014-03-22 ENCOUNTER — Other Ambulatory Visit: Payer: Self-pay | Admitting: Family Medicine

## 2014-03-22 ENCOUNTER — Ambulatory Visit
Admission: RE | Admit: 2014-03-22 | Discharge: 2014-03-22 | Disposition: A | Discharge status: Home / Self Care | Payer: Medicare HMO | Source: Ambulatory Visit | Attending: Family Medicine | Admitting: Family Medicine

## 2014-03-22 DIAGNOSIS — R1013 Epigastric pain: Secondary | ICD-10-CM

## 2014-08-01 ENCOUNTER — Ambulatory Visit (INDEPENDENT_AMBULATORY_CARE_PROVIDER_SITE_OTHER): Payer: PPO | Admitting: Neurology

## 2014-08-01 ENCOUNTER — Encounter: Payer: Self-pay | Admitting: Neurology

## 2014-08-01 VITALS — BP 128/70 | HR 76 | Ht 68.0 in | Wt 195.1 lb

## 2014-08-01 DIAGNOSIS — E538 Deficiency of other specified B group vitamins: Secondary | ICD-10-CM | POA: Diagnosis not present

## 2014-08-01 DIAGNOSIS — F0150 Vascular dementia without behavioral disturbance: Secondary | ICD-10-CM | POA: Diagnosis not present

## 2014-08-01 NOTE — Progress Notes (Signed)
Joshua [name] was seen today in the movement disorders clinic for neurologic consultation at the request of Simona Huh, MD.  The consultation is for the evaluation of gait and memory changes.  Pt is accompanied by his daughter who is his POA and wife, who supplement the history.  His daughter brought in several notes for me to review prior to his visit.  Her biggest concern is the patient's memory loss and that started about 1 year ago and that got worse over the last year.  The memory seems to fluctuate.  The patients wife has Parkinson's disease and they are the only 2 in the home.  However, the patient is not able to assist much with the caregiving of his wife, who is still very functional.  He will get dressed and then forget that he has gotten dressed and try to put on another pair of pants on top of the first pair that he just put on.  Recently, he got his shaving cream mixed up with soap and sprayed shaving cream all over the bathroom.  He is still driving, and he will forget where to go.  He admits that he "messes up even in familiar places."   The pt has lived in the same home with his wife for 71 years.  They live in a one story home.  His wife has always taken care of finances.  He takes care of distributing his own medications but last week they got him his own pill box that his wife is preparing.  She wasn't sure that the medications were getting taken properly.  There are no word finding troubles.  No appetite troubles.  Pt admits that he sleeps most of the day.  Until December, he was working and since then, he really hasn't gotten into any type of routine.  There are no hallucinations.    01/31/14 update:  Pt is f/u today.  No one accompanies him back to the room.  I started the patient on aricept last visit.  The patient reports that he is doing well on this medication.  The patient did have his thyroid rechecked by his primary care physician since last visit as his TSH was  elevated at 5.16 last visit, but after treatment it had improved and TSH in June, 2015 was now 0.91.  However, his hemoglobin A1c in June was 9.2 and this was repeated on 12/29/2013 and was 9.3.  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  I set him up with physical therapy.  His B12 has been a little low and he admits that he only takes the oral supplement about 2-3 times per week.    08/01/14 update:  Patient is following up today for his vascular dementia.  He has a history of B12 deficiency.  We recheck this last visit and it was normal at 485 but he has since quit taking his B12.  He is on Aricept and is doing well with this medication.  He and his wife feel that he has been stable in terms of memory. He physically helps take care of his wife, who has PD.  His DM has not been well controlled   Neuroimaging has  previously been performed.  It is available for my review today.  A CT of the brain was performed in 2012 demonstrating atrophy  PREVIOUS MEDICATIONS: none to date  ALLERGIES:  No Known  Allergies  CURRENT MEDICATIONS:  Current Outpatient Prescriptions on File Prior to Visit  Medication Sig Dispense Refill  . donepezil (ARICEPT) 10 MG tablet Take 1 tablet (10 mg total) by mouth at bedtime. 90 tablet 3  . insulin regular (NOVOLIN R,HUMULIN R) 100 units/mL injection Inject 4 Units into the skin 1 day or 1 dose.    . simvastatin (ZOCOR) 40 MG tablet Take 20 mg by mouth every evening.    . sitaGLIPtin (JANUVIA) 100 MG tablet Take 100 mg by mouth daily.     No current facility-administered medications on file prior to visit.    PAST MEDICAL HISTORY:   Past Medical History  Diagnosis Date  . Hyperlipidemia   . CKD (chronic kidney disease), stage III   . Type 2 diabetes mellitus   . BPH (benign prostatic hypertrophy)   . Hypothyroidism   . GERD (gastroesophageal reflux disease)   . H/O hiatal hernia     . Asymptomatic stenosis of right carotid artery     MILD RIGHT ICA  <40%  PER DUPLEX STUDY  2003  . Short of breath on exertion   . History of gastric ulcer   . Frequency of urination   . Nocturia   . Urgency of urination     PAST SURGICAL HISTORY:   Past Surgical History  Procedure Laterality Date  . Cardiac catheterization  01-07-2000  DR Daneen Schick    ESSENTIALLY NORMAL CORONARY ARTERIES W/ MINIMAL PLAQUING RCA & LAD/ NORMAL LVF/ SEVERE TORTUOSITY AORTOILIAC AND FEMORAL TERRITORY / FALSE ABNORMAL CARDIOLITE  . Total knee arthroplasty Bilateral LEFT  10-04-2004/   RIGHT  03-17-2008  . Lumbar disc surgery  1994  . Transurethral resection of prostate N/A 02/01/2013    Procedure: TRANSURETHRAL RESECTION OF THE PROSTATEAND TURB  WITH GYRUS INSTRUMENTS;  Surgeon: Bernestine Amass, MD;  Location: [facility];  Service: Urology;  Laterality: N/A;    SOCIAL HISTORY:   History   Social History  . Marital Status: Married    Spouse Name: N/A  . Number of Children: N/A  . Years of Education: N/A   Occupational History  . Not on file.   Social History Main Topics  . Smoking status: Never Smoker   . Smokeless tobacco: Never Used  . Alcohol Use: No  . Drug Use: No  . Sexual Activity: Not on file   Other Topics Concern  . Not on file   Social History Narrative    FAMILY HISTORY:   Family Status  Relation Status Death Age  . Mother Deceased     diabetes  . Father Deceased     heart disease, diabetes  . Sister Alive   . Brother Alive   . Daughter Alive   . Son Alive   . Maternal Grandmother Deceased   . Maternal Grandfather Deceased   . Paternal Grandmother Deceased   . Paternal Grandfather Deceased   . Brother Alive   . Brother Alive   . Brother Deceased     heart disease    ROS:  A complete 10 system review of systems was obtained and was unremarkable apart from what is mentioned above.  PHYSICAL EXAMINATION:    VITALS:   Filed Vitals:    08/01/14 0903  BP: 128/70  Pulse: 76  Height: 5\' 8"  (1.727 m)  Weight: 195 lb 2 oz (88.508 kg)  SpO2: 96%    GEN:  The patient appears stated age and is in NAD. HEENT:  Normocephalic,  atraumatic.  The mucous membranes are moist. The superficial temporal arteries are without ropiness or tenderness. CV:  RRR Lungs:  CTAB Neck/HEME:  There are no carotid bruits bilaterally.  Neurological examination:  Orientation:  Montreal Cognitive Assessment  08/01/2014  Visuospatial/ Executive (0/5) 2  Naming (0/3) 2  Attention: Read list of digits (0/2) 1  Attention: Read list of letters (0/1) 1  Attention: Serial 7 subtraction starting at 100 (0/3) 1  Language: Repeat phrase (0/2) 2  Language : Fluency (0/1) 0  Abstraction (0/2) 0  Delayed Recall (0/5) 0  Orientation (0/6) 5  Total 14  Adjusted Score (based on education) 15    Sensation: Sensation is intact to light and pinprick throughout (facial, trunk, extremities). Vibration is decreased at the bilateral big toe. There is no extinction with double simultaneous stimulation. There is no sensory dermatomal level identified. Motor: Strength is 5/5 in the bilateral upper and lower extremities.   Shoulder shrug is equal and symmetric.  There is no pronator drift. Frontal release signs: There is a bilateral palmomental response.  There is a positive glabellar tap (Myerson sign).  No rooting reflex.  Movement examination: Tone: There is no increased tone in the upper extremities/LE Abnormal movements: There was a mild tremor of the outstretched hands Coordination:  There is no significant decremation with RAM's Gait and Station: The patient has minimal difficulty arising out of a deep-seated chair without the use of the hands.  He was able to arise without the use of the hands on the second attempt.  The patient's stride length is decreased, with a wide-based gait.  He is not shuffling today.  ASSESSMENT/PLAN:  1.  Memory loss  -I do think  that this is multifactorial, but think the primary etiology is vascular dementia.  He is doing well on aricept and he can continue that.   -We talked again about safety. We talked about the importance of medication monitoring.  We talked about driving.  I do not want him to drive, unless he passes an occupational therapy driving evaluation.  Right now, he was not willing to do that, so I told him that I did not want him to drive.   2.  gait changes.  -I do think he has evidence of vascular parkinsonism.  I do not think he has idiopathic Parkinson's disease.  We talked about the differences.  He has had no falls 3.  B12 deficiency  -needs to restart his oral B12 supplements 4.  F/u 6-8 months, sooner should new neuro issues arise.

## 2014-08-01 NOTE — Patient Instructions (Addendum)
1.  Restart your B12 - 1000 mcg daily 2.  Continue your aricept - 10 mg daily 3.  I will see you in 6-8 months, but you should call me if you need me sooner

## 2014-08-25 ENCOUNTER — Other Ambulatory Visit: Payer: Self-pay | Admitting: Neurology

## 2014-08-25 NOTE — Telephone Encounter (Signed)
Aricept refill requested. Per last office note- patient to remain on medication. Refill approved and sent to patient's pharmacy.   

## 2014-10-05 ENCOUNTER — Other Ambulatory Visit (HOSPITAL_COMMUNITY): Payer: Self-pay | Admitting: Orthopaedic Surgery

## 2014-10-05 DIAGNOSIS — M545 Low back pain: Secondary | ICD-10-CM

## 2014-10-10 ENCOUNTER — Other Ambulatory Visit: Payer: Self-pay

## 2014-10-14 ENCOUNTER — Ambulatory Visit (HOSPITAL_COMMUNITY)
Admission: RE | Admit: 2014-10-14 | Discharge: 2014-10-14 | Disposition: A | Discharge status: Home / Self Care | Payer: PPO | Source: Ambulatory Visit | Attending: Orthopaedic Surgery | Admitting: Orthopaedic Surgery

## 2014-10-14 DIAGNOSIS — M4806 Spinal stenosis, lumbar region: Secondary | ICD-10-CM | POA: Diagnosis not present

## 2014-10-14 DIAGNOSIS — M79605 Pain in left leg: Secondary | ICD-10-CM | POA: Insufficient documentation

## 2014-10-14 DIAGNOSIS — M545 Low back pain: Secondary | ICD-10-CM

## 2014-11-09 ENCOUNTER — Telehealth: Payer: Self-pay | Admitting: Interventional Cardiology

## 2014-11-09 DIAGNOSIS — R001 Bradycardia, unspecified: Secondary | ICD-10-CM

## 2014-11-09 NOTE — Telephone Encounter (Signed)
Beverlee Nims from Dr. Delene Ruffini office called to inform Dr. Tamala Julian that patient is experiencing bradycardia and PVC's and possible sick sinus syndrome. Patient st that his HR was in the 30s, but at the office his HR is in the 60s. The patient is otherwise stable. Dr. Laurann Montana wants Dr. Tamala Julian to see the patient within the next 2 weeks. Informed Beverlee Nims that per [facility] records, the patient has not seen Dr. Tamala Julian since 2001.  Beverlee Nims st cardiac testing was done 2014.  Beverlee Nims is faxing over records as well as OV note and EKG from today. AttnLattie Haw.  Informed Beverlee Nims that Dr. Thompson Caul nurse will call patient with appointment.

## 2014-11-09 NOTE — Telephone Encounter (Signed)
[facility]  [facility]    Sick Sinus syndrome

## 2014-11-09 NOTE — Telephone Encounter (Signed)
Message below noted 30day event monitor ordered (ok per Dr.Turner DOD) to evaluate brady r/o sick sinus. appt to re-establish with Dr.Smith scheduled on 8/9 (awaiting [place] records). Adv pt a scheduler from our office will call him to schedule his heart monitor. Pt aware of appt and Dr.Smith's new office location. Pt agreeable and verbalized understanding.

## 2014-11-11 ENCOUNTER — Ambulatory Visit (INDEPENDENT_AMBULATORY_CARE_PROVIDER_SITE_OTHER): Payer: PPO

## 2014-11-11 DIAGNOSIS — R001 Bradycardia, unspecified: Secondary | ICD-10-CM | POA: Diagnosis not present

## 2014-11-16 ENCOUNTER — Telehealth: Payer: Self-pay | Admitting: Interventional Cardiology

## 2014-11-16 NOTE — Telephone Encounter (Signed)
Returned call to [employer]. Provided them th dx code for PVC. No further assistance needed

## 2014-11-16 NOTE — Telephone Encounter (Signed)
New message     Needs all cardiac diagnosis for pt  Pt has heart monitor Please call to discuss

## 2014-11-21 DIAGNOSIS — K259 Gastric ulcer, unspecified as acute or chronic, without hemorrhage or perforation: Secondary | ICD-10-CM | POA: Insufficient documentation

## 2014-11-21 DIAGNOSIS — I2510 Atherosclerotic heart disease of native coronary artery without angina pectoris: Secondary | ICD-10-CM | POA: Insufficient documentation

## 2014-11-21 DIAGNOSIS — G214 Vascular parkinsonism: Secondary | ICD-10-CM | POA: Insufficient documentation

## 2014-11-21 DIAGNOSIS — R001 Bradycardia, unspecified: Secondary | ICD-10-CM | POA: Insufficient documentation

## 2014-11-21 DIAGNOSIS — E785 Hyperlipidemia, unspecified: Secondary | ICD-10-CM | POA: Insufficient documentation

## 2014-11-21 NOTE — Progress Notes (Signed)
Cardiology Office Note   Date:  11/22/2014   ID:  Sammuel Hines, DOB 07/14/1928, MRN [id number]  PCP:  Simona Huh, MD  Cardiologist:  Sinclair Grooms, MD   Chief Complaint  Patient presents with  . Bradycardia      History of Present Illness: Joshua [name] is a 81 y.o. male who presents for bradycardia, hyperlipidemia, chronic kidney disease, and most recently bradycardia. Patient has memory disorder and carotid artery disease. Features of vascular Parkinsonism.  Asymptomatic bradycardia was reported after having a lumbar spine injection several weeks ago. It was said that the heart rate was 30 bpm. This was obtained by peripheral pulse. He was asymptomatic. He has never had syncope. He denies dizziness.there are complaints of exertional fatigue. This is been ongoing for several years but progressive.  Past Medical History  Diagnosis Date  . Hyperlipidemia   . CKD (chronic kidney disease), stage III   . Type 2 diabetes mellitus   . BPH (benign prostatic hypertrophy)   . Hypothyroidism   . GERD (gastroesophageal reflux disease)   . H/O hiatal hernia   . Asymptomatic stenosis of right carotid artery     MILD RIGHT ICA  <40%  PER DUPLEX STUDY  2003  . Short of breath on exertion   . History of gastric ulcer   . Frequency of urination   . Nocturia   . Urgency of urination     Past Surgical History  Procedure Laterality Date  . Cardiac catheterization  01-07-2000  DR Daneen Schick    ESSENTIALLY NORMAL CORONARY ARTERIES W/ MINIMAL PLAQUING RCA & LAD/ NORMAL LVF/ SEVERE TORTUOSITY AORTOILIAC AND FEMORAL TERRITORY / FALSE ABNORMAL CARDIOLITE  . Total knee arthroplasty Bilateral LEFT  10-04-2004/   RIGHT  03-17-2008  . Lumbar disc surgery  1994  . Transurethral resection of prostate N/A 02/01/2013    Procedure: TRANSURETHRAL RESECTION OF THE PROSTATEAND TURB  WITH GYRUS INSTRUMENTS;  Surgeon: Bernestine Amass, MD;  Location: [facility];  Service:  Urology;  Laterality: N/A;     Current Outpatient Prescriptions  Medication Sig Dispense Refill  . B-D ULTRAFINE III SHORT PEN 31G X 8 MM MISC See admin instructions. use with insulin pen  5  . donepezil (ARICEPT) 10 MG tablet TAKE 1 TABLET (10 MG TOTAL) BY MOUTH AT BEDTIME. 90 tablet 3  . glipiZIDE (GLUCOTROL XL) 10 MG 24 hr tablet Take 10 mg by mouth daily.  2  . levothyroxine (SYNTHROID, LEVOTHROID) 75 MCG tablet Take 75 mcg by mouth daily before breakfast.   3  . NOVOLOG FLEXPEN 100 UNIT/ML FlexPen Inject 6 Units into the skin daily before breakfast.   3  . simvastatin (ZOCOR) 40 MG tablet Take 20 mg by mouth every evening.    . sitaGLIPtin (JANUVIA) 100 MG tablet Take 100 mg by mouth daily.    Marland Kitchen VICTOZA 18 MG/3ML SOPN Inject 6 units into the skin once daily  0   No current facility-administered medications for this visit.    Allergies:   Review of patient's allergies indicates no known allergies.    Social History:  The patient  reports that he has never smoked. He has never used smokeless tobacco. He reports that he does not drink alcohol or use illicit drugs.   Family History:  The patient's family history includes Diabetes in his father and mother; Heart disease in his brother and father.    ROS:  Please see the history of present illness.  Otherwise, review of systems are positive for memory loss, decreased hearing, occasional shortness of breath, snoring, constipation, anxiety, dizziness, and fatigue..   All other systems are reviewed and negative.    PHYSICAL EXAM: VS:  BP 124/80 mmHg  Pulse 53  Ht 5\' 8"  (1.727 m)  Wt 86.728 kg (191 lb 3.2 oz)  BMI 29.08 kg/m2 , BMI Body mass index is 29.08 kg/(m^2). GEN: Well nourished, well developed, in no acute distress HEENT: normal Neck: no JVD, carotid bruits, or masses Cardiac: RRR; no murmurs, rubs, or gallops,no edema  Respiratory:  clear to auscultation bilaterally, normal work of breathing GI: soft, nontender,  nondistended, + BS MS: no deformity or atrophy Skin: warm and dry, no rash Neuro:  Strength and sensation are intact Psych: euthymic mood, full affect   EKG:  EKG is ordered today. The ekg ordered today demonstrates sinus bradycardia with mild first-degree AV block and otherwise normal   Recent Labs: No results found for requested labs within last 365 days.    Lipid Panel No results found for: CHOL, TRIG, HDL, CHOLHDL, VLDL, LDLCALC, LDLDIRECT    Wt Readings from Last 3 Encounters:  11/22/14 86.728 kg (191 lb 3.2 oz)  08/01/14 88.508 kg (195 lb 2 oz)  01/31/14 87.998 kg (194 lb)      Other studies Reviewed: Additional studies/ records that were reviewed today include: . Review of the above records demonstrates:    ASSESSMENT AND PLAN:  1. Bradycardia Not substantiated by an electrocardiographic tracing but rather a peripheral pulse was palpated. Therefore alternative explanations could of been ventricular bigeminy or other false measurement. We will plan to do a 30 Joshua monitor to exclude excessive bradycardia due to sinus node dysfunction or advanced AV block.  2. Coronary artery disease involving native coronary artery of native heart without angina pectoris asymptomatic  3. Hyperlipidemia Managed by primary care  4. Arteriosclerotic Parkinsonism Bradykinesia noted  5. Controlled diabetes mellitus type 2 with complications Managed by primary care    Current medicines are reviewed at length with the patient today.  The patient does not have concerns regarding medicines.  The following changes have been made:  Continuous 30 Joshua monitor to rule out excessive pauses and bradycardia.  Labs/ tests ordered today include:   Orders Placed This Encounter  Procedures  . EKG 12-Lead     Disposition:   FU with HS in  PRN  Signed, Sinclair Grooms, MD  11/22/2014 12:35 PM    [address] [place], [place], [place]  [address] Phone: [phone]; Fax: [phone]

## 2014-11-22 ENCOUNTER — Ambulatory Visit (INDEPENDENT_AMBULATORY_CARE_PROVIDER_SITE_OTHER): Payer: PPO | Admitting: Interventional Cardiology

## 2014-11-22 ENCOUNTER — Encounter: Payer: Self-pay | Admitting: Interventional Cardiology

## 2014-11-22 VITALS — BP 124/80 | HR 53 | Ht 68.0 in | Wt 191.2 lb

## 2014-11-22 DIAGNOSIS — E785 Hyperlipidemia, unspecified: Secondary | ICD-10-CM | POA: Diagnosis not present

## 2014-11-22 DIAGNOSIS — R001 Bradycardia, unspecified: Secondary | ICD-10-CM | POA: Diagnosis not present

## 2014-11-22 DIAGNOSIS — E118 Type 2 diabetes mellitus with unspecified complications: Secondary | ICD-10-CM

## 2014-11-22 DIAGNOSIS — I2510 Atherosclerotic heart disease of native coronary artery without angina pectoris: Secondary | ICD-10-CM

## 2014-11-22 DIAGNOSIS — G214 Vascular parkinsonism: Secondary | ICD-10-CM

## 2014-11-22 DIAGNOSIS — G20 Parkinson's disease: Secondary | ICD-10-CM

## 2014-11-22 NOTE — Patient Instructions (Signed)
Medication Instructions:  Your physician recommends that you continue on your current medications as directed. Please refer to the Current Medication list given to you today.   Labwork: None   Testing/Procedures: None   Follow-Up: Your physician recommends that you schedule a follow-up appointment as needed   Any Other Special Instructions Will Be Listed Below (If Applicable). We will call you with the results of your cardiac monitor

## 2014-12-16 ENCOUNTER — Telehealth: Payer: Self-pay

## 2014-12-16 NOTE — Telephone Encounter (Signed)
Called to give pt monitor results. lmtcb 

## 2014-12-16 NOTE — Telephone Encounter (Signed)
Pt aware of monitor results. No evidence of atrial fibrillation. No need for pacemaker based upon the current study. Pt verbalized understanding.

## 2014-12-16 NOTE — Telephone Encounter (Signed)
-----   Message from Belva Crome, MD sent at 12/15/2014  6:30 PM EDT ----- No evidence of atrial fibrillation. No need for pacemaker based upon the current study.

## 2014-12-23 NOTE — Telephone Encounter (Signed)
Forwarded to Dr. Smith

## 2015-04-24 ENCOUNTER — Encounter: Payer: Self-pay | Admitting: Endocrinology

## 2015-04-24 ENCOUNTER — Ambulatory Visit (INDEPENDENT_AMBULATORY_CARE_PROVIDER_SITE_OTHER): Payer: PPO | Admitting: Endocrinology

## 2015-04-24 VITALS — BP 132/76 | HR 57 | Temp 97.7°F | Ht 68.0 in | Wt 192.0 lb

## 2015-04-24 DIAGNOSIS — K219 Gastro-esophageal reflux disease without esophagitis: Secondary | ICD-10-CM | POA: Insufficient documentation

## 2015-04-24 DIAGNOSIS — C61 Malignant neoplasm of prostate: Secondary | ICD-10-CM | POA: Diagnosis not present

## 2015-04-24 DIAGNOSIS — M1990 Unspecified osteoarthritis, unspecified site: Secondary | ICD-10-CM | POA: Diagnosis not present

## 2015-04-24 DIAGNOSIS — E1159 Type 2 diabetes mellitus with other circulatory complications: Secondary | ICD-10-CM

## 2015-04-24 DIAGNOSIS — E039 Hypothyroidism, unspecified: Secondary | ICD-10-CM | POA: Diagnosis not present

## 2015-04-24 MED ORDER — INSULIN ASPART 100 UNIT/ML FLEXPEN: 5.0000 [IU] | PEN_INJECTOR | Freq: Three times a day (TID) | SUBCUTANEOUS | Status: DC

## 2015-04-24 NOTE — Patient Instructions (Addendum)
good diet and exercise significantly improve the control of your diabetes.  please let me know if you wish to be referred to a dietician.  high blood sugar is very risky to your health.  you should see an eye doctor and dentist every year.  It is very important to get all recommended vaccinations.  controlling your blood pressure and cholesterol drastically reduces the damage diabetes does to your body.  Those who smoke should quit.  please discuss these with your doctor.  check your blood sugar twice a day.  vary the time of day when you check, between before the 3 meals, and at bedtime.  also check if you have symptoms of your blood sugar being too high or too low.  please keep a record of the readings and bring it to your next appointment here (or you can bring the meter itself).  You can write it on any piece of paper.  please call us sooner if your blood sugar goes below 70, or if you have a lot of readings over 200.  For now: Please stop taking the [place] and glipizide, and: Please continue the same farxiga and trulicity, and: Increase the novolog to 5 units 3 times a day (just before each meal).  Please call us in a few das, to tell us how the blood sugar is doing.  Please come back for a follow-up appointment in 2-3 weeks.

## 2015-04-24 NOTE — Progress Notes (Signed)
Subjective:    Patient ID: Joshua [name], male    DOB: December 16, 1928, 82 y.o.   MRN: [id number]  HPI pt states DM was dx'ed in approx 1996; he has mild neuropathy of the lower extremities; he has associated CAD and renal failure; he has been on insulin since approx 2014; pt says his diet is good, but exercise is limited by cold weather; he has never had pancreatitis, severe hypoglycemia or DKA. he brings a record of his cbg's which i have reviewed today.  All are in the high-100's.   Past Medical History  Diagnosis Date  . Hyperlipidemia   . CKD (chronic kidney disease), stage III   . Type 2 diabetes mellitus ([place])   . BPH (benign prostatic hypertrophy)   . Hypothyroidism   . GERD (gastroesophageal reflux disease)   . H/O hiatal hernia   . Asymptomatic stenosis of right carotid artery     MILD RIGHT ICA  <40%  PER DUPLEX STUDY  2003  . Short of breath on exertion   . History of gastric ulcer   . Frequency of urination   . Nocturia   . Urgency of urination     Past Surgical History  Procedure Laterality Date  . Cardiac catheterization  01-07-2000  DR Daneen Schick    ESSENTIALLY NORMAL CORONARY ARTERIES W/ MINIMAL PLAQUING RCA & LAD/ NORMAL LVF/ SEVERE TORTUOSITY AORTOILIAC AND FEMORAL TERRITORY / FALSE ABNORMAL CARDIOLITE  . Total knee arthroplasty Bilateral LEFT  10-04-2004/   RIGHT  03-17-2008  . Lumbar disc surgery  1994  . Transurethral resection of prostate N/A 02/01/2013    Procedure: TRANSURETHRAL RESECTION OF THE PROSTATEAND TURB  WITH GYRUS INSTRUMENTS;  Surgeon: Bernestine Amass, MD;  Location: [facility];  Service: Urology;  Laterality: N/A;    Social History   Social History  . Marital Status: Married    Spouse Name: N/A  . Number of Children: N/A  . Years of Education: N/A   Occupational History  . Not on file.   Social History Main Topics  . Smoking status: Never Smoker   . Smokeless tobacco: Never Used  . Alcohol Use: No  . Drug Use: No    . Sexual Activity: Not on file   Other Topics Concern  . Not on file   Social History Narrative    Current Outpatient Prescriptions on File Prior to Visit  Medication Sig Dispense Refill  . B-D ULTRAFINE III SHORT PEN 31G X 8 MM MISC See admin instructions. use with insulin pen  5  . donepezil (ARICEPT) 10 MG tablet TAKE 1 TABLET (10 MG TOTAL) BY MOUTH AT BEDTIME. 90 tablet 3  . levothyroxine (SYNTHROID, LEVOTHROID) 75 MCG tablet Take 75 mcg by mouth daily before breakfast.   3  . simvastatin (ZOCOR) 40 MG tablet Take 20 mg by mouth every evening.     No current facility-administered medications on file prior to visit.    No Known Allergies  Family History  Problem Relation Age of Onset  . Diabetes Mother   . Diabetes Father   . Heart disease Father   . Heart disease Brother     BP 132/76 mmHg  Pulse 57  Temp(Src) 97.7 F (36.5 C) (Oral)  Ht 5\' 8"  (1.727 m)  Wt 192 lb (87.091 kg)  BMI 29.20 kg/m2  SpO2 97%  Review of Systems denies weight loss, headache, chest pain, sob, n/v, urinary frequency, muscle cramps, excessive diaphoresis, memory loss, and easy bruising.  He has slight blurry vision, rhinorrhea, and cold intolerance.      Objective:   Physical Exam VS: see vs page GEN: no distress HEAD: head: no deformity eyes: no periorbital swelling, no proptosis external nose and ears are normal mouth: no lesion seen NECK: supple, thyroid is not enlarged CHEST WALL: no deformity LUNGS:  Clear to auscultation CV: reg rate and rhythm, no murmur ABD: abdomen is soft, nontender.  no hepatosplenomegaly.  not distended.  no hernia MUSCULOSKELETAL: muscle bulk and strength are grossly normal.  no obvious joint swelling.  gait is normal and steady EXTEMITIES: no deformity.  no ulcer on the feet.  feet are of normal color and temp.  no edema, but there are bilat varicosities of the ankles and feet.   PULSES: dorsalis pedis intact bilat.  no carotid bruit NEURO:  cn 2-12  grossly intact.   readily moves all 4's.  sensation is intact to touch on the feet, but decreased from normal.  SKIN:  Normal texture and temperature.  No rash or suspicious lesion is visible.   NODES:  None palpable at the neck PSYCH: alert, well-oriented.  Does not appear anxious nor depressed.   <ECG> i personally reviewed electrocardiogram tracing (11/22/14): Indication: CAD Impression: sinus bradycardia  I have reviewed outside records, and summarized: Pt was noted to have elevated a1c, and referred here.  outside test results are reviewed: A1c=9.2% Creat=2     Assessment & Plan:  DM: he needs increased rx CAD: in this context, a goal a1c would be in the 7's, without hypoglycemia. Renal failure: in this context, we'll continue fast-acting insulin rx'ed by Dr Marisue Humble.   Patient is advised the following: Patient Instructions  good diet and exercise significantly improve the control of your diabetes.  please let me know if you wish to be referred to a dietician.  high blood sugar is very risky to your health.  you should see an eye doctor and dentist every year.  It is very important to get all recommended vaccinations.  controlling your blood pressure and cholesterol drastically reduces the damage diabetes does to your body.  Those who smoke should quit.  please discuss these with your doctor.  check your blood sugar twice a day.  vary the time of day when you check, between before the 3 meals, and at bedtime.  also check if you have symptoms of your blood sugar being too high or too low.  please keep a record of the readings and bring it to your next appointment here (or you can bring the meter itself).  You can write it on any piece of paper.  please call us sooner if your blood sugar goes below 70, or if you have a lot of readings over 200.  For now: Please stop taking the [place] and glipizide, and: Please continue the same farxiga and trulicity, and: Increase the novolog to 5  units 3 times a day (just before each meal).  Please call us in a few das, to tell us how the blood sugar is doing.  Please come back for a follow-up appointment in 2-3 weeks.

## 2015-04-28 ENCOUNTER — Telehealth: Payer: Self-pay | Admitting: Endocrinology

## 2015-04-28 NOTE — Telephone Encounter (Signed)
Patient is giving you his b/s reading  1/10 Mor 161 Noon 256  Evening 158 1/12 Mor 178 Noon 226  Evening  138 1/13 220 Patient stated he wasn't doing to good, please advise

## 2015-04-28 NOTE — Telephone Encounter (Signed)
Pt advised of note below and voiced understanding.  

## 2015-04-28 NOTE — Telephone Encounter (Signed)
Sorry--i meant to say increase to 7 units 3 times a day (just before each meal)

## 2015-04-28 NOTE — Telephone Encounter (Signed)
Please continue the same farxiga and trulicity, and: Increase the novolog to 5 units 3 times a day (just before each meal).  i'll see you next time.

## 2015-04-28 NOTE — Telephone Encounter (Signed)
See note below and please advise, Thanks! 

## 2015-04-28 NOTE — Telephone Encounter (Signed)
Pt advised of note below. Pt sated this is the same medication regimen he has been taking for a while and does not think the dosage is helping. Please advise, Thanks!

## 2015-05-08 ENCOUNTER — Ambulatory Visit (INDEPENDENT_AMBULATORY_CARE_PROVIDER_SITE_OTHER): Payer: PPO | Admitting: Endocrinology

## 2015-05-08 ENCOUNTER — Encounter: Payer: Self-pay | Admitting: Endocrinology

## 2015-05-08 VITALS — BP 118/64 | HR 59 | Temp 97.9°F | Wt 191.0 lb

## 2015-05-08 DIAGNOSIS — E1159 Type 2 diabetes mellitus with other circulatory complications: Secondary | ICD-10-CM | POA: Diagnosis not present

## 2015-05-08 NOTE — Progress Notes (Signed)
Subjective:    Patient ID: Joshua [name], male    DOB: February 18, 1929, 82 y.o.   MRN: [id number]  HPI Pt returns for f/u of diabetes mellitus: DM type: Insulin-requiring type 2 Dx'ed: 99991111 Complications: polyneuropathy, CAD, and renal failure Therapy: insulin since 2014 DKA: never Severe hypoglycemia: never Pancreatitis: never Other: he takes multiple daily injections; rx options are limited by renal failure;  Interval history: Meter is downloaded today, and the printout is scanned into the record.  It varies from 90-400.  It is lowest in the afternoon, and highest at hs. Past Medical History  Diagnosis Date  . Hyperlipidemia   . CKD (chronic kidney disease), stage III   . Type 2 diabetes mellitus ([place])   . BPH (benign prostatic hypertrophy)   . Hypothyroidism   . GERD (gastroesophageal reflux disease)   . H/O hiatal hernia   . Asymptomatic stenosis of right carotid artery     MILD RIGHT ICA  <40%  PER DUPLEX STUDY  2003  . Short of breath on exertion   . History of gastric ulcer   . Frequency of urination   . Nocturia   . Urgency of urination     Past Surgical History  Procedure Laterality Date  . Cardiac catheterization  01-07-2000  DR Daneen Schick    ESSENTIALLY NORMAL CORONARY ARTERIES W/ MINIMAL PLAQUING RCA & LAD/ NORMAL LVF/ SEVERE TORTUOSITY AORTOILIAC AND FEMORAL TERRITORY / FALSE ABNORMAL CARDIOLITE  . Total knee arthroplasty Bilateral LEFT  10-04-2004/   RIGHT  03-17-2008  . Lumbar disc surgery  1994  . Transurethral resection of prostate N/A 02/01/2013    Procedure: TRANSURETHRAL RESECTION OF THE PROSTATEAND TURB  WITH GYRUS INSTRUMENTS;  Surgeon: Bernestine Amass, MD;  Location: [facility];  Service: Urology;  Laterality: N/A;    Social History   Social History  . Marital Status: Married    Spouse Name: N/A  . Number of Children: N/A  . Years of Education: N/A   Occupational History  . Not on file.   Social History Main Topics  .  Smoking status: Never Smoker   . Smokeless tobacco: Never Used  . Alcohol Use: No  . Drug Use: No  . Sexual Activity: Not on file   Other Topics Concern  . Not on file   Social History Narrative    Current Outpatient Prescriptions on File Prior to Visit  Medication Sig Dispense Refill  . B Complex Vitamins (B-COMPLEX/B-12) TABS Take by mouth.    . B-D ULTRAFINE III SHORT PEN 31G X 8 MM MISC See admin instructions. use with insulin pen  5  . dapagliflozin propanediol (FARXIGA) 10 MG TABS tablet Take 10 mg by mouth daily.    Marland Kitchen donepezil (ARICEPT) 10 MG tablet TAKE 1 TABLET (10 MG TOTAL) BY MOUTH AT BEDTIME. 90 tablet 3  . levothyroxine (SYNTHROID, LEVOTHROID) 75 MCG tablet Take 75 mcg by mouth daily before breakfast.   3  . simvastatin (ZOCOR) 40 MG tablet Take 20 mg by mouth every evening.     No current facility-administered medications on file prior to visit.    No Known Allergies  Family History  Problem Relation Age of Onset  . Diabetes Mother   . Diabetes Father   . Heart disease Father   . Heart disease Brother     BP 118/64 mmHg  Pulse 59  Temp(Src) 97.9 F (36.6 C) (Oral)  Wt 191 lb (86.637 kg)  SpO2 95%   Review  of Systems He denies hypoglycemia    Objective:   Physical Exam VITAL SIGNS:  See vs page GENERAL: no distress SKIN:  Insulin injection sites at the anterior abdomen are normal.        Assessment & Plan:  DM: good progress Renal failure: in this context, we have to transition to insulin only.    Patient is advised the following: Patient Instructions  check your blood sugar twice a day.  vary the time of day when you check, between before the 3 meals, and at bedtime.  also check if you have symptoms of your blood sugar being too high or too low.  please keep a record of the readings and bring it to your next appointment here (or you can bring the meter itself).  You can write it on any piece of paper.  please call us sooner if your blood sugar  goes below 70, or if you have a lot of readings over 200.  For now: Please stop taking the trulicity, and: please continue the same farxiga, and: Increase the novolog to 3 times a day (just before each meal), 12-19-09 units Please call us in a few days, to tell us how the blood sugar is doing.  Please come back for a follow-up appointment in 2-3 weeks.

## 2015-05-08 NOTE — Patient Instructions (Addendum)
check your blood sugar twice a day.  vary the time of day when you check, between before the 3 meals, and at bedtime.  also check if you have symptoms of your blood sugar being too high or too low.  please keep a record of the readings and bring it to your next appointment here (or you can bring the meter itself).  You can write it on any piece of paper.  please call us sooner if your blood sugar goes below 70, or if you have a lot of readings over 200.  For now: Please stop taking the trulicity, and: please continue the same farxiga, and: Increase the novolog to 3 times a day (just before each meal), 12-19-09 units Please call us in a few days, to tell us how the blood sugar is doing.  Please come back for a follow-up appointment in 2-3 weeks.

## 2015-05-23 ENCOUNTER — Telehealth: Payer: Self-pay

## 2015-05-23 MED ORDER — INSULIN GLARGINE 100 UNIT/ML SOLOSTAR PEN: PEN_INJECTOR | SUBCUTANEOUS | Status: DC

## 2015-05-23 NOTE — Telephone Encounter (Signed)
Rx submitted and pt notified of new instructions. See notes below to be advised on changes made.

## 2015-05-23 NOTE — Telephone Encounter (Signed)
He needs to start taking Lantus insulin 10 units at suppertime daily in addition to  Novolog and increase Novolog to 11 units at breakfast also

## 2015-05-23 NOTE — Telephone Encounter (Signed)
Pt called to report blood sugar.  05/22/2015: Lunchtime: 243 Bedtime: 279  05/23/2015: 6 am fasting: 249 2 hours after breakfast: 455  Pt is currently taking 9 units of Novolog at breakfast 6 units at lunch and 11 units at supper. Could you review and advise during Dr. Cordelia Pen absence? Thanks!

## 2015-05-25 ENCOUNTER — Encounter: Payer: Self-pay | Admitting: Endocrinology

## 2015-05-25 ENCOUNTER — Ambulatory Visit (INDEPENDENT_AMBULATORY_CARE_PROVIDER_SITE_OTHER): Payer: PPO | Admitting: Endocrinology

## 2015-05-25 VITALS — BP 128/84 | HR 61 | Temp 97.5°F | Ht 68.0 in

## 2015-05-25 DIAGNOSIS — E1159 Type 2 diabetes mellitus with other circulatory complications: Secondary | ICD-10-CM

## 2015-05-25 NOTE — Progress Notes (Signed)
Subjective:    Patient ID: Joshua [name], male    DOB: 01/21/1929, 82 y.o.   MRN: [id number]  HPI Pt returns for f/u of diabetes mellitus: DM type: Insulin-requiring type 2 Dx'ed: 99991111 Complications: polyneuropathy, CAD, and renal failure.   Therapy: insulin since 2014 DKA: never Severe hypoglycemia: never. Pancreatitis: never Other: he takes multiple daily injections; rx options are limited by renal failure; plan is to transition to insulin only Interval history: lantus was recently added.  Meter is downloaded today, and the printout is scanned into the record.  He does not check at hs.  It varies from 100-400.  It is highest at lunch. Past Medical History  Diagnosis Date  . Hyperlipidemia   . CKD (chronic kidney disease), stage III   . Type 2 diabetes mellitus ([place])   . BPH (benign prostatic hypertrophy)   . Hypothyroidism   . GERD (gastroesophageal reflux disease)   . H/O hiatal hernia   . Asymptomatic stenosis of right carotid artery     MILD RIGHT ICA  <40%  PER DUPLEX STUDY  2003  . Short of breath on exertion   . History of gastric ulcer   . Frequency of urination   . Nocturia   . Urgency of urination     Past Surgical History  Procedure Laterality Date  . Cardiac catheterization  01-07-2000  DR Daneen Schick    ESSENTIALLY NORMAL CORONARY ARTERIES W/ MINIMAL PLAQUING RCA & LAD/ NORMAL LVF/ SEVERE TORTUOSITY AORTOILIAC AND FEMORAL TERRITORY / FALSE ABNORMAL CARDIOLITE  . Total knee arthroplasty Bilateral LEFT  10-04-2004/   RIGHT  03-17-2008  . Lumbar disc surgery  1994  . Transurethral resection of prostate N/A 02/01/2013    Procedure: TRANSURETHRAL RESECTION OF THE PROSTATEAND TURB  WITH GYRUS INSTRUMENTS;  Surgeon: Bernestine Amass, MD;  Location: [facility];  Service: Urology;  Laterality: N/A;    Social History   Social History  . Marital Status: Married    Spouse Name: N/A  . Number of Children: N/A  . Years of Education: N/A    Occupational History  . Not on file.   Social History Main Topics  . Smoking status: Never Smoker   . Smokeless tobacco: Never Used  . Alcohol Use: No  . Drug Use: No  . Sexual Activity: Not on file   Other Topics Concern  . Not on file   Social History Narrative    Current Outpatient Prescriptions on File Prior to Visit  Medication Sig Dispense Refill  . B Complex Vitamins (B-COMPLEX/B-12) TABS Take by mouth.    . B-D ULTRAFINE III SHORT PEN 31G X 8 MM MISC See admin instructions. use with insulin pen  5  . donepezil (ARICEPT) 10 MG tablet TAKE 1 TABLET (10 MG TOTAL) BY MOUTH AT BEDTIME. 90 tablet 3  . insulin aspart (NOVOLOG FLEXPEN) 100 UNIT/ML FlexPen Inject into the skin 3 (three) times daily with meals. 3 times a day (just before each meal), 20-10-15 units, and pen needles 3/day.    . Insulin Glargine (LANTUS SOLOSTAR) 100 UNIT/ML Solostar Pen 10 units at supper time. 5 pen 2  . levothyroxine (SYNTHROID, LEVOTHROID) 75 MCG tablet Take 75 mcg by mouth daily before breakfast.   3  . simvastatin (ZOCOR) 40 MG tablet Take 20 mg by mouth every evening.     No current facility-administered medications on file prior to visit.    No Known Allergies  Family History  Problem Relation Age of  Onset  . Diabetes Mother   . Diabetes Father   . Heart disease Father   . Heart disease Brother     BP 128/84 mmHg  Pulse 61  Temp(Src) 97.5 F (36.4 C) (Oral)  Ht 5\' 8"  (1.727 m)  SpO2 93%  Review of Systems He denies hypoglycemia    Objective:   Physical Exam VITAL SIGNS:  See vs page GENERAL: no distress EXTEMITIES: no deformity.   feet are of normal color and temp.   CV: no edema, but there are bilat varicosities of the ankles and feet.   PULSES: dorsalis pedis intact bilat.  NEURO:  sensation is intact to touch on the feet, but decreased from normal.  SKIN: no ulcer on the feet.   Lab Results  Component Value Date   CREATININE 1.53* 02/02/2013   BUN 19 02/02/2013    NA 139 02/02/2013   K 4.4 02/02/2013   CL 106 02/02/2013   CO2 27 02/02/2013       Assessment & Plan:  DM: glycemic control is worse.  Renal failure: in this setting, he may not need basal insulin.   Patient is advised the following: Patient Instructions  check your blood sugar twice a day.  vary the time of day when you check, between before the 3 meals, and at bedtime.  also check if you have symptoms of your blood sugar being too high or too low.  please keep a record of the readings and bring it to your next appointment here (or you can bring the meter itself).  You can write it on any piece of paper.  please call us sooner if your blood sugar goes below 70, or if you have a lot of readings over 200.  For now, please continue the same farxiga, and: Increase the novolog to 3 times a day (just before each meal), 20-10-15 units Please continue the lantus for now, but I don't know if you will continue to need this. Please call us in a few days, to tell us how the blood sugar is doing.  Please come back for a follow-up appointment in 2-3 weeks.

## 2015-05-25 NOTE — Patient Instructions (Addendum)
check your blood sugar twice a day.  vary the time of day when you check, between before the 3 meals, and at bedtime.  also check if you have symptoms of your blood sugar being too high or too low.  please keep a record of the readings and bring it to your next appointment here (or you can bring the meter itself).  You can write it on any piece of paper.  please call us sooner if your blood sugar goes below 70, or if you have a lot of readings over 200.  For now, please continue the same farxiga, and: Increase the novolog to 3 times a day (just before each meal), 20-10-15 units Please continue the lantus for now, but I don't know if you will continue to need this. Please call us in a few days, to tell us how the blood sugar is doing.  Please come back for a follow-up appointment in 2-3 weeks.

## 2015-06-01 ENCOUNTER — Ambulatory Visit (HOSPITAL_BASED_OUTPATIENT_CLINIC_OR_DEPARTMENT_OTHER): Payer: PPO

## 2015-06-05 ENCOUNTER — Ambulatory Visit (INDEPENDENT_AMBULATORY_CARE_PROVIDER_SITE_OTHER): Payer: PPO | Admitting: Endocrinology

## 2015-06-05 ENCOUNTER — Encounter: Payer: Self-pay | Admitting: Endocrinology

## 2015-06-05 VITALS — BP 122/54 | HR 59 | Temp 97.6°F | Ht 68.0 in | Wt 191.0 lb

## 2015-06-05 DIAGNOSIS — E1122 Type 2 diabetes mellitus with diabetic chronic kidney disease: Secondary | ICD-10-CM | POA: Diagnosis not present

## 2015-06-05 DIAGNOSIS — E119 Type 2 diabetes mellitus without complications: Secondary | ICD-10-CM | POA: Insufficient documentation

## 2015-06-05 DIAGNOSIS — Z794 Long term (current) use of insulin: Secondary | ICD-10-CM

## 2015-06-05 DIAGNOSIS — N183 Chronic kidney disease, stage 3 (moderate): Secondary | ICD-10-CM | POA: Diagnosis not present

## 2015-06-05 DIAGNOSIS — E1159 Type 2 diabetes mellitus with other circulatory complications: Secondary | ICD-10-CM | POA: Diagnosis not present

## 2015-06-05 LAB — POCT GLYCOSYLATED HEMOGLOBIN (HGB A1C): HEMOGLOBIN A1C: 8.8

## 2015-06-05 MED ORDER — INSULIN ASPART 100 UNIT/ML FLEXPEN: PEN_INJECTOR | SUBCUTANEOUS | Status: DC

## 2015-06-05 NOTE — Patient Instructions (Addendum)
check your blood sugar twice a day.  vary the time of day when you check, between before the 3 meals, and at bedtime.  also check if you have symptoms of your blood sugar being too high or too low.  please keep a record of the readings and bring it to your next appointment here (or you can bring the meter itself).  You can write it on any piece of paper.  please call us sooner if your blood sugar goes below 70, or if you have a lot of readings over 200.  Increase the novolog to 3 times a day (just before each meal), 30-20-30 units, and: Stop taking the lantus. Please come back for a follow-up appointment in 2 weeks.

## 2015-06-05 NOTE — Progress Notes (Signed)
Subjective:    Patient ID: Joshua [name], male    DOB: [birth year], 82 y.o.   MRN: [id number]  HPI Pt returns for f/u of diabetes mellitus: DM type: Insulin-requiring type 2 Dx'ed: 99991111 Complications: polyneuropathy, CAD, and renal failure.   Therapy: insulin since 2014 DKA: never Severe hypoglycemia: never.  Pancreatitis: never.   Other: he takes multiple daily injections; rx options are limited by renal failure; plan is to transition to insulin only Interval history: lantus was recently added.  Meter is downloaded today, and the printout is scanned into the record.   It varies from 150-300's.  There is no trend throughout the day.   Past Medical History  Diagnosis Date  . Hyperlipidemia   . CKD (chronic kidney disease), stage III   . Type 2 diabetes mellitus ([place])   . BPH (benign prostatic hypertrophy)   . Hypothyroidism   . GERD (gastroesophageal reflux disease)   . H/O hiatal hernia   . Asymptomatic stenosis of right carotid artery     MILD RIGHT ICA  <40%  PER DUPLEX STUDY  2003  . Short of breath on exertion   . History of gastric ulcer   . Frequency of urination   . Nocturia   . Urgency of urination     Past Surgical History  Procedure Laterality Date  . Cardiac catheterization  01-07-2000  DR Daneen Schick    ESSENTIALLY NORMAL CORONARY ARTERIES W/ MINIMAL PLAQUING RCA & LAD/ NORMAL LVF/ SEVERE TORTUOSITY AORTOILIAC AND FEMORAL TERRITORY / FALSE ABNORMAL CARDIOLITE  . Total knee arthroplasty Bilateral LEFT  10-04-2004/   RIGHT  03-17-2008  . Lumbar disc surgery  1994  . Transurethral resection of prostate N/A 02/01/2013    Procedure: TRANSURETHRAL RESECTION OF THE PROSTATEAND TURB  WITH GYRUS INSTRUMENTS;  Surgeon: Bernestine Amass, MD;  Location: [facility];  Service: Urology;  Laterality: N/A;    Social History   Social History  . Marital Status: Married    Spouse Name: N/A  . Number of Children: N/A  . Years of Education: N/A    Occupational History  . Not on file.   Social History Main Topics  . Smoking status: Never Smoker   . Smokeless tobacco: Never Used  . Alcohol Use: No  . Drug Use: No  . Sexual Activity: Not on file   Other Topics Concern  . Not on file   Social History Narrative    Current Outpatient Prescriptions on File Prior to Visit  Medication Sig Dispense Refill  . B Complex Vitamins (B-COMPLEX/B-12) TABS Take by mouth.    . B-D ULTRAFINE III SHORT PEN 31G X 8 MM MISC See admin instructions. use with insulin pen  5  . donepezil (ARICEPT) 10 MG tablet TAKE 1 TABLET (10 MG TOTAL) BY MOUTH AT BEDTIME. 90 tablet 3  . levothyroxine (SYNTHROID, LEVOTHROID) 75 MCG tablet Take 75 mcg by mouth daily before breakfast.   3  . simvastatin (ZOCOR) 40 MG tablet Take 20 mg by mouth every evening.     No current facility-administered medications on file prior to visit.    No Known Allergies  Family History  Problem Relation Age of Onset  . Diabetes Mother   . Diabetes Father   . Heart disease Father   . Heart disease Brother     BP 122/54 mmHg  Pulse 59  Temp(Src) 97.6 F (36.4 C) (Oral)  Ht 5\' 8"  (1.727 m)  Wt 191 lb (86.637 kg)  BMI 29.05 kg/m2  SpO2 91%   Review of Systems He denies hypoglycemia.     Objective:   Physical Exam VITAL SIGNS:  See vs page GENERAL: no distress Pulses: dorsalis pedis intact bilat.   MSK: no deformity of the feet CV: no leg edema Skin:  no ulcer on the feet.  normal color and temp on the feet. Neuro: sensation is intact to touch on the feet.      A1c=8.8%    Assessment & Plan:  DM: she needs increased rx Renal failure: this is the most likely reason why his cbg's do not appear to show a need for basal insulin.     Patient is advised the following: Patient Instructions  check your blood sugar twice a day.  vary the time of day when you check, between before the 3 meals, and at bedtime.  also check if you have symptoms of your blood sugar  being too high or too low.  please keep a record of the readings and bring it to your next appointment here (or you can bring the meter itself).  You can write it on any piece of paper.  please call us sooner if your blood sugar goes below 70, or if you have a lot of readings over 200.  Increase the novolog to 3 times a day (just before each meal), 30-20-30 units, and: Stop taking the lantus. Please come back for a follow-up appointment in 2 weeks.

## 2015-06-12 ENCOUNTER — Telehealth: Payer: Self-pay | Admitting: Endocrinology

## 2015-06-12 ENCOUNTER — Telehealth: Payer: Self-pay

## 2015-06-12 NOTE — Telephone Encounter (Signed)
See note below and please advise, Thanks! 

## 2015-06-12 NOTE — Telephone Encounter (Signed)
Increase the novolog to 3 times a day (just before each meal), 40-30-40 units,

## 2015-06-12 NOTE — Telephone Encounter (Signed)
Patient states that his blood sugar is running between 250-500  Symptoms:  Dizzy Weak  Just dont feel right   Please advise patient   Thank you

## 2015-06-12 NOTE — Telephone Encounter (Signed)
This note was entered by mistake. °

## 2015-06-12 NOTE — Telephone Encounter (Signed)
Pt advised of note below and voiced understanding.  

## 2015-06-19 ENCOUNTER — Encounter: Payer: Self-pay | Admitting: Endocrinology

## 2015-06-19 ENCOUNTER — Encounter: Payer: PPO | Attending: Endocrinology | Admitting: Nutrition

## 2015-06-19 ENCOUNTER — Ambulatory Visit (INDEPENDENT_AMBULATORY_CARE_PROVIDER_SITE_OTHER): Payer: PPO | Admitting: Endocrinology

## 2015-06-19 VITALS — BP 122/72 | HR 64 | Temp 97.5°F | Ht 68.0 in | Wt 198.0 lb

## 2015-06-19 DIAGNOSIS — E1122 Type 2 diabetes mellitus with diabetic chronic kidney disease: Secondary | ICD-10-CM

## 2015-06-19 DIAGNOSIS — N183 Chronic kidney disease, stage 3 unspecified: Secondary | ICD-10-CM

## 2015-06-19 DIAGNOSIS — Z794 Long term (current) use of insulin: Secondary | ICD-10-CM | POA: Diagnosis not present

## 2015-06-19 DIAGNOSIS — E1165 Type 2 diabetes mellitus with hyperglycemia: Secondary | ICD-10-CM

## 2015-06-19 DIAGNOSIS — IMO0002 Reserved for concepts with insufficient information to code with codable children: Secondary | ICD-10-CM

## 2015-06-19 MED ORDER — INSULIN ASPART 100 UNIT/ML FLEXPEN: PEN_INJECTOR | SUBCUTANEOUS | Status: DC

## 2015-06-19 NOTE — Progress Notes (Deleted)
Patient is injecting his Novolog into a small area on either side of his navel, and the tissue has become hardened--scar tissue, making it harder to inject the insulin.   I showed him different areas on his abdomen to use for the insulin injections and a rotation schedule to use.   He was told to stay away for his old sites.  He agreed to do this.

## 2015-06-19 NOTE — Progress Notes (Signed)
Subjective:    Patient ID: Joshua [name], male    DOB: 01-12-1929, 82 y.o.   MRN: [id number]  HPI Pt returns for f/u of diabetes mellitus: DM type: Insulin-requiring type 2 Dx'ed: 99991111 Complications: polyneuropathy, CAD, and renal failure.   Therapy: insulin since 2014 DKA: never. Severe hypoglycemia: never.  Pancreatitis: never.   Other: he takes multiple daily injections; rx options are limited by renal failure; plan is to transition to insulin only Interval history: It varies from 200-400.  He says he usually takes insulin after eating.  He says it was lower 4 days ago, when he was active in the afternoon.  Meter is downloaded today, and the printout is scanned into the record.  There is no trend throughout the day, except it is in general lowest in the afternoon.  It is lower in am than at hs. Past Medical History  Diagnosis Date  . Hyperlipidemia   . CKD (chronic kidney disease), stage III   . Type 2 diabetes mellitus ([place])   . BPH (benign prostatic hypertrophy)   . Hypothyroidism   . GERD (gastroesophageal reflux disease)   . H/O hiatal hernia   . Asymptomatic stenosis of right carotid artery     MILD RIGHT ICA  <40%  PER DUPLEX STUDY  2003  . Short of breath on exertion   . History of gastric ulcer   . Frequency of urination   . Nocturia   . Urgency of urination     Past Surgical History  Procedure Laterality Date  . Cardiac catheterization  01-07-2000  DR Daneen Schick    ESSENTIALLY NORMAL CORONARY ARTERIES W/ MINIMAL PLAQUING RCA & LAD/ NORMAL LVF/ SEVERE TORTUOSITY AORTOILIAC AND FEMORAL TERRITORY / FALSE ABNORMAL CARDIOLITE  . Total knee arthroplasty Bilateral LEFT  10-04-2004/   RIGHT  03-17-2008  . Lumbar disc surgery  1994  . Transurethral resection of prostate N/A 02/01/2013    Procedure: TRANSURETHRAL RESECTION OF THE PROSTATEAND TURB  WITH GYRUS INSTRUMENTS;  Surgeon: Bernestine Amass, MD;  Location: [facility];  Service: Urology;   Laterality: N/A;    Social History   Social History  . Marital Status: Married    Spouse Name: N/A  . Number of Children: N/A  . Years of Education: N/A   Occupational History  . Not on file.   Social History Main Topics  . Smoking status: Never Smoker   . Smokeless tobacco: Never Used  . Alcohol Use: No  . Drug Use: No  . Sexual Activity: Not on file   Other Topics Concern  . Not on file   Social History Narrative    Current Outpatient Prescriptions on File Prior to Visit  Medication Sig Dispense Refill  . B Complex Vitamins (B-COMPLEX/B-12) TABS Take by mouth.    . B-D ULTRAFINE III SHORT PEN 31G X 8 MM MISC See admin instructions. use with insulin pen  5  . donepezil (ARICEPT) 10 MG tablet TAKE 1 TABLET (10 MG TOTAL) BY MOUTH AT BEDTIME. 90 tablet 3  . levothyroxine (SYNTHROID, LEVOTHROID) 75 MCG tablet Take 75 mcg by mouth daily before breakfast.   3  . simvastatin (ZOCOR) 40 MG tablet Take 20 mg by mouth every evening.     No current facility-administered medications on file prior to visit.    No Known Allergies  Family History  Problem Relation Age of Onset  . Diabetes Mother   . Diabetes Father   . Heart disease Father   .  Heart disease Brother    BP 122/72 mmHg  Pulse 64  Temp(Src) 97.5 F (36.4 C) (Oral)  Ht 5\' 8"  (1.727 m)  Wt 198 lb (89.812 kg)  BMI 30.11 kg/m2  SpO2 96%  Review of Systems Denies LOC    Objective:   Physical Exam VITAL SIGNS:  See vs page GENERAL: no distress Pulses: dorsalis pedis intact bilat.  MSK: no deformity of the feet CV: 1+ bilat leg edema, and bilat varicosities Skin: no ulcer on the feet. normal color and temp on the feet. Neuro: sensation is intact to touch on the feet, but decreased from normal.   Lab Results  Component Value Date   HGBA1C 8.8 06/05/2015      Assessment & Plan:  DM: he needs increased rx.  Renal failure: in this setting, he does not need basal insulin.  cbg record confirms this.    Noncompliance with taking insulin before esting: we discussed the need to take insulin just before or with eating  Patient is advised the following: Patient Instructions  check your blood sugar twice a day.  vary the time of day when you check, between before the 3 meals, and at bedtime.  also check if you have symptoms of your blood sugar being too high or too low.  please keep a record of the readings and bring it to your next appointment here (or you can bring the meter itself).  You can write it on any piece of paper.  please call us sooner if your blood sugar goes below 70, or if you have a lot of readings over 200.  Increase the novolog to 3 times a day (just before each meal), 50-30-50 units.  However, if you are going to be active in the afternoon, take just 20 with lunch.   Please come back for a follow-up appointment in 2-3 weeks.

## 2015-06-19 NOTE — Patient Instructions (Signed)
Stay away from previous 2 injection sites on abdomen.  Rotate injection sites over entire abdomen.  Especially the sights that I showed you Stop eating cereal and milk in the morning.  Make sure each breakfast meal has some protein. Cal if questions

## 2015-06-19 NOTE — Progress Notes (Signed)
Patient is injecting his Novolog into a small area on either side of his navel, and the tissue has become hardened--scar tissue, making it harder to inject the insulin.   I showed him different areas on his abdomen to use for the insulin injections and a rotation schedule to use.   He was told to stay away for his old sites.  He agreed to do this.  He mentioned that he is eating cold cereal and milk and fruit in the mornings, and I suggested he try other options that will not cause he blood sugars to rise so rapidly.  Suggestions were given for toast with peanut butter, or cheese, egg/toast and grits Daughter requested to speak with me about her father's diet and I suggested that she schedule an appt. With me with her father next appt with Dr. Loanne Drilling. In 2 weeks.  She agreed to do this.

## 2015-06-19 NOTE — Patient Instructions (Addendum)
check your blood sugar twice a day.  vary the time of day when you check, between before the 3 meals, and at bedtime.  also check if you have symptoms of your blood sugar being too high or too low.  please keep a record of the readings and bring it to your next appointment here (or you can bring the meter itself).  You can write it on any piece of paper.  please call us sooner if your blood sugar goes below 70, or if you have a lot of readings over 200.  Increase the novolog to 3 times a day (just before each meal), 50-30-50 units.  However, if you are going to be active in the afternoon, take just 20 with lunch.   Please come back for a follow-up appointment in 2-3 weeks.

## 2015-07-09 NOTE — Patient Instructions (Addendum)
check your blood sugar twice a day.  vary the time of day when you check, between before the 3 meals, and at bedtime.  also check if you have symptoms of your blood sugar being too high or too low.  please keep a record of the readings and bring it to your next appointment here (or you can bring the meter itself).  You can write it on any piece of paper.  please call us sooner if your blood sugar goes below 70, or if you have a lot of readings over 200.  change the novolog to NPH, 120 units each morning.  i have sent a prescription to your pharmacy Please call us in a few days, to tell us how the blood sugar is doing. Please come back for a follow-up appointment in 1 month.

## 2015-07-09 NOTE — Progress Notes (Signed)
Subjective:    Patient ID: Joshua [name], male    DOB: 1928-11-01, 82 y.o.   MRN: [id number]  HPI Pt returns for f/u of diabetes mellitus: DM type: Insulin-requiring type 2 Dx'ed: 99991111 Complications: polyneuropathy, CAD, retinopathy, and renal failure.   Therapy: insulin since 2014 DKA: never. Severe hypoglycemia: never.  Pancreatitis: never.   Other: he takes multiple daily injections.   Interval history: It varies from 200-400.  He says he usually takes insulin after eating.  He says it was lower 4 days ago, when he was active in the afternoon.  Meter is downloaded today, and the printout is scanned into the record.  It varies from 57-500.  There is no trend throughout the day, except it is in general lowest in the afternoon.  It is lower in am than at hs.  He has some cbg's below 100 at all times of day.  He says he never misses the insulin.   Past Medical History  Diagnosis Date  . Hyperlipidemia   . CKD (chronic kidney disease), stage III   . Type 2 diabetes mellitus ([place])   . BPH (benign prostatic hypertrophy)   . Hypothyroidism   . GERD (gastroesophageal reflux disease)   . H/O hiatal hernia   . Asymptomatic stenosis of right carotid artery     MILD RIGHT ICA  <40%  PER DUPLEX STUDY  2003  . Short of breath on exertion   . History of gastric ulcer   . Frequency of urination   . Nocturia   . Urgency of urination     Past Surgical History  Procedure Laterality Date  . Cardiac catheterization  01-07-2000  DR Daneen Schick    ESSENTIALLY NORMAL CORONARY ARTERIES W/ MINIMAL PLAQUING RCA & LAD/ NORMAL LVF/ SEVERE TORTUOSITY AORTOILIAC AND FEMORAL TERRITORY / FALSE ABNORMAL CARDIOLITE  . Total knee arthroplasty Bilateral LEFT  10-04-2004/   RIGHT  03-17-2008  . Lumbar disc surgery  1994  . Transurethral resection of prostate N/A 02/01/2013    Procedure: TRANSURETHRAL RESECTION OF THE PROSTATEAND TURB  WITH GYRUS INSTRUMENTS;  Surgeon: Bernestine Amass, MD;  Location: [facility];  Service: Urology;  Laterality: N/A;    Social History   Social History  . Marital Status: Married    Spouse Name: N/A  . Number of Children: N/A  . Years of Education: N/A   Occupational History  . Not on file.   Social History Main Topics  . Smoking status: Never Smoker   . Smokeless tobacco: Never Used  . Alcohol Use: No  . Drug Use: No  . Sexual Activity: Not on file   Other Topics Concern  . Not on file   Social History Narrative    Current Outpatient Prescriptions on File Prior to Visit  Medication Sig Dispense Refill  . B Complex Vitamins (B-COMPLEX/B-12) TABS Take by mouth.    . B-D ULTRAFINE III SHORT PEN 31G X 8 MM MISC See admin instructions. use with insulin pen  5  . donepezil (ARICEPT) 10 MG tablet TAKE 1 TABLET (10 MG TOTAL) BY MOUTH AT BEDTIME. 90 tablet 3  . levothyroxine (SYNTHROID, LEVOTHROID) 75 MCG tablet Take 75 mcg by mouth daily before breakfast.   3  . simvastatin (ZOCOR) 40 MG tablet Take 20 mg by mouth every evening.     No current facility-administered medications on file prior to visit.    No Known Allergies  Family History  Problem Relation Age of Onset  .  Diabetes Mother   . Diabetes Father   . Heart disease Father   . Heart disease Brother     BP 122/62 mmHg  Pulse 71  Temp(Src) 97.9 F (36.6 C) (Oral)  Ht 5\' 8"  (1.727 m)  Wt 197 lb (89.359 kg)  BMI 29.96 kg/m2  SpO2 96%  Review of Systems Denies LOC.  He has chronically poor vision (so he must use an insulin pen)    Objective:   Physical Exam VITAL SIGNS: See vs page GENERAL: no distress Pulses: dorsalis pedis intact bilat.  MSK: no deformity of the feet CV: trace bilat leg edema, and bilat varicosities Skin: no ulcer on the feet. normal color and temp on the feet. Neuro: sensation is intact to touch on the feet, but decreased from normal.   Lab Results  Component Value Date   HGBA1C 8.8 06/05/2015      Assessment & Plan:  DM: he  needs increased rx, if it can be done with a regimen that avoids or minimizes hypoglycemia.  He needs a simpler regimen. Visual loss: in this setting, he needs an insulin pen.  Hypoglycemia: at his advanced age, he needs to avoid this.    Patient is advised the following: Patient Instructions  check your blood sugar twice a day.  vary the time of day when you check, between before the 3 meals, and at bedtime.  also check if you have symptoms of your blood sugar being too high or too low.  please keep a record of the readings and bring it to your next appointment here (or you can bring the meter itself).  You can write it on any piece of paper.  please call us sooner if your blood sugar goes below 70, or if you have a lot of readings over 200.  change the novolog to NPH, 120 units each morning.  i have sent a prescription to your pharmacy Please call us in a few days, to tell us how the blood sugar is doing. Please come back for a follow-up appointment in 1 month.

## 2015-07-10 ENCOUNTER — Ambulatory Visit: Payer: PPO | Admitting: Endocrinology

## 2015-07-10 ENCOUNTER — Ambulatory Visit (INDEPENDENT_AMBULATORY_CARE_PROVIDER_SITE_OTHER): Payer: PPO | Admitting: Endocrinology

## 2015-07-10 ENCOUNTER — Ambulatory Visit: Payer: PPO | Admitting: Nutrition

## 2015-07-10 ENCOUNTER — Encounter: Payer: Self-pay | Admitting: Endocrinology

## 2015-07-10 VITALS — BP 122/62 | HR 71 | Temp 97.9°F | Ht 68.0 in | Wt 197.0 lb

## 2015-07-10 DIAGNOSIS — E1122 Type 2 diabetes mellitus with diabetic chronic kidney disease: Secondary | ICD-10-CM | POA: Diagnosis not present

## 2015-07-10 DIAGNOSIS — N183 Chronic kidney disease, stage 3 unspecified: Secondary | ICD-10-CM

## 2015-07-10 DIAGNOSIS — Z794 Long term (current) use of insulin: Secondary | ICD-10-CM

## 2015-07-10 MED ORDER — INSULIN ISOPHANE HUMAN 100 UNIT/ML KWIKPEN: 120.0000 [IU] | PEN_INJECTOR | SUBCUTANEOUS | Status: DC

## 2015-07-10 NOTE — Progress Notes (Signed)
I discussed with the patient, his daughter and wife the directions for giving only NPH insulin.  He was told to make sure he mixes the pen well, before injecting,and to do it in 2 injections of 60u.  We reviewed the need to rotate sites and to not inject both injections close to each other. He was shown all the sites for injection, and ways to rotate them.  In review of his diet, he continues to eat cold cereal and milk for breakfast most mornings, and was given other breakfast choices that he would like.   Lunch seems to be his larges meal, but is limiting his carbs to this meal at 30-45.  He denies snacking between meals, but has ice cream after lunch and/or supper--3 scoops.  Strongly encouraged him to reduce this to 2 scoops and tol limit his starchy veg., when having ice cream, to only 1.  He agreed to do this.   Discussed with daughter the fact that he needs 2-3 ounces protein at lunch and supper, and no more than 2 servings of carbohydrate at each meal, if having ice cream.  She reported good understanding of this and had no final questions.

## 2015-07-10 NOTE — Patient Instructions (Signed)
Give 60u of NPH twice in the morning, about 30 min. Before breakfast. Give the injections at least 4 inches apart from each other. Rotate your injection sites --moving at least 2 inches from previous day.   Stop cold cereal and milk in the AM. Add protein to every meal. Limit ice cream to 3 times/wk, and no more than 2 scoops.

## 2015-07-11 ENCOUNTER — Other Ambulatory Visit: Payer: Self-pay

## 2015-07-11 MED ORDER — INSULIN NPH (HUMAN) (ISOPHANE) 100 UNIT/ML ~~LOC~~ SUSP: SUBCUTANEOUS | Status: DC

## 2015-07-13 ENCOUNTER — Other Ambulatory Visit: Payer: Self-pay

## 2015-07-13 MED ORDER — "INSULIN SYRINGE 30G X 5/16"" 1 ML MISC": Status: DC

## 2015-07-14 ENCOUNTER — Telehealth: Payer: Self-pay | Admitting: Endocrinology

## 2015-07-14 NOTE — Telephone Encounter (Signed)
I contacted the pt. Pt wanted to report this morning he took his Novolin N 120 units. Fasting this morning his blood sugar was above 300 (pt did not know the exact number). Pt stated about 1 pm he started to feel like his blood sugar had dropped and was about to pass out. Pt was unable to check his blood sugar at this point. Pt stated he ate 2 candy bars and drank 1 can of soda and afterwards checked his blood sugar at 3pm and it was at 88. Pt is concerned 120 units of insulin daily is too much please advise, Thanks!

## 2015-07-14 NOTE — Telephone Encounter (Signed)
Please reduce to 100 units qam

## 2015-07-14 NOTE — Telephone Encounter (Signed)
I contacted the pt and advised on new instructions. Per pt's requested I contacted his daughter Santiago Glad [phone]) and left a vm advising of new instructions as well.

## 2015-07-14 NOTE — Telephone Encounter (Signed)
PT requests CB from you

## 2015-07-18 ENCOUNTER — Telehealth: Payer: Self-pay | Admitting: Endocrinology

## 2015-07-18 NOTE — Telephone Encounter (Signed)
Please verify ot takes NPH, 100 units qam Then decrease to 80 units qam

## 2015-07-18 NOTE — Telephone Encounter (Signed)
See note below and please advise, Thanks! 

## 2015-07-18 NOTE — Telephone Encounter (Signed)
Pt's daughter advised of new instructions and voiced understanding.

## 2015-07-18 NOTE — Telephone Encounter (Signed)
Patient daughter stated since patient medication insulin has been changed (she didn't know what the name of it) his b/s has been dropping the last couple of day running in the [place].  Please advise

## 2015-07-19 ENCOUNTER — Telehealth: Payer: Self-pay | Admitting: Endocrinology

## 2015-07-19 NOTE — Telephone Encounter (Signed)
Pt's daughter advised of note below and voiced understanding.  

## 2015-07-19 NOTE — Telephone Encounter (Signed)
Please verify ot takes NPH, 80 units qam Then decrease to 50 units qam

## 2015-07-19 NOTE — Telephone Encounter (Signed)
See note below and please advise, Thanks! 

## 2015-07-19 NOTE — Telephone Encounter (Signed)
Pt has had some lows again and does not feel well has dropped to 80

## 2015-08-09 ENCOUNTER — Encounter (HOSPITAL_BASED_OUTPATIENT_CLINIC_OR_DEPARTMENT_OTHER): Payer: PPO

## 2015-08-14 ENCOUNTER — Encounter: Payer: Self-pay | Admitting: Endocrinology

## 2015-08-14 ENCOUNTER — Ambulatory Visit (INDEPENDENT_AMBULATORY_CARE_PROVIDER_SITE_OTHER): Payer: PPO | Admitting: Endocrinology

## 2015-08-14 VITALS — BP 124/62 | HR 59 | Temp 97.6°F | Resp 14 | Ht 68.0 in | Wt 202.0 lb

## 2015-08-14 DIAGNOSIS — Z794 Long term (current) use of insulin: Secondary | ICD-10-CM

## 2015-08-14 DIAGNOSIS — N183 Chronic kidney disease, stage 3 unspecified: Secondary | ICD-10-CM

## 2015-08-14 DIAGNOSIS — E1122 Type 2 diabetes mellitus with diabetic chronic kidney disease: Secondary | ICD-10-CM | POA: Diagnosis not present

## 2015-08-14 LAB — POCT GLYCOSYLATED HEMOGLOBIN (HGB A1C): Hemoglobin A1C: 9

## 2015-08-14 MED ORDER — INSULIN NPH (HUMAN) (ISOPHANE) 100 UNIT/ML ~~LOC~~ SUSP: 45.0000 [IU] | SUBCUTANEOUS | Status: DC

## 2015-08-14 NOTE — Progress Notes (Signed)
Subjective:    Patient ID: Joshua [name], male    DOB: 05-19-1928, 82 y.o.   MRN: [id number]  HPI Pt returns for f/u of diabetes mellitus:  DM type: Insulin-requiring type 2.  Dx'ed: 1996.  Complications: polyneuropathy, CAD, retinopathy, and renal failure.   Therapy: insulin since 2014. DKA: never. Severe hypoglycemia: never.  Pancreatitis: never.   Other: he takes QD insulin, after poor results with multiple daily injections.   Interval history: He says he never misses the insulin. He takes 50 units each morning.  Meter is downloaded today, and the printout is scanned into the record.  It varies from 68-400's.  He says the 400 was after an episode of mild hypoglycemia.   Past Medical History  Diagnosis Date  . Hyperlipidemia   . CKD (chronic kidney disease), stage III   . Type 2 diabetes mellitus ([place])   . BPH (benign prostatic hypertrophy)   . Hypothyroidism   . GERD (gastroesophageal reflux disease)   . H/O hiatal hernia   . Asymptomatic stenosis of right carotid artery     MILD RIGHT ICA  <40%  PER DUPLEX STUDY  2003  . Short of breath on exertion   . History of gastric ulcer   . Frequency of urination   . Nocturia   . Urgency of urination     Past Surgical History  Procedure Laterality Date  . Cardiac catheterization  01-07-2000  DR Daneen Schick    ESSENTIALLY NORMAL CORONARY ARTERIES W/ MINIMAL PLAQUING RCA & LAD/ NORMAL LVF/ SEVERE TORTUOSITY AORTOILIAC AND FEMORAL TERRITORY / FALSE ABNORMAL CARDIOLITE  . Total knee arthroplasty Bilateral LEFT  10-04-2004/   RIGHT  03-17-2008  . Lumbar disc surgery  1994  . Transurethral resection of prostate N/A 02/01/2013    Procedure: TRANSURETHRAL RESECTION OF THE PROSTATEAND TURB  WITH GYRUS INSTRUMENTS;  Surgeon: Bernestine Amass, MD;  Location: [facility];  Service: Urology;  Laterality: N/A;    Social History   Social History  . Marital Status: Married    Spouse Name: N/A  . Number of Children: N/A    . Years of Education: N/A   Occupational History  . Not on file.   Social History Main Topics  . Smoking status: Never Smoker   . Smokeless tobacco: Never Used  . Alcohol Use: No  . Drug Use: No  . Sexual Activity: Not on file   Other Topics Concern  . Not on file   Social History Narrative    Current Outpatient Prescriptions on File Prior to Visit  Medication Sig Dispense Refill  . B Complex Vitamins (B-COMPLEX/B-12) TABS Take by mouth.    . B-D ULTRAFINE III SHORT PEN 31G X 8 MM MISC See admin instructions. use with insulin pen  5  . donepezil (ARICEPT) 10 MG tablet TAKE 1 TABLET (10 MG TOTAL) BY MOUTH AT BEDTIME. 90 tablet 3  . Insulin Syringe-Needle U-100 (INSULIN SYRINGE 1CC/30GX5/16") 30G X 5/16" 1 ML MISC Use to inject insulin 2 times per day. 100 each 2  . levothyroxine (SYNTHROID, LEVOTHROID) 75 MCG tablet Take 75 mcg by mouth daily before breakfast.   3  . simvastatin (ZOCOR) 40 MG tablet Take 20 mg by mouth every evening.     No current facility-administered medications on file prior to visit.    No Known Allergies  Family History  Problem Relation Age of Onset  . Diabetes Mother   . Diabetes Father   . Heart disease Father   .  Heart disease Brother     BP 124/62 mmHg  Pulse 59  Temp(Src) 97.6 F (36.4 C) (Oral)  Resp 14  Ht 5\' 8"  (1.727 m)  Wt 202 lb (91.627 kg)  BMI 30.72 kg/m2  SpO2 95%  Review of Systems Denies LOC    Objective:   Physical Exam VITAL SIGNS:  See vs page GENERAL: no distress.  Pulses: dorsalis pedis intact bilat.   MSK: no deformity of the feet  CV: trace bilat leg edema, and bilat varicosities.   Skin: no ulcer on the feet. normal color and temp on the feet.  Neuro: sensation is intact to touch on the feet, but decreased from normal.    A1c=9.0%    Assessment & Plan:  DM: he seems to be doing better overall on qd insulin.  However, the highs seem to follow episodes of mild hypoglycemia.  Patient is advised the  following: Patient Instructions  check your blood sugar twice a day.  vary the time of day when you check, between before the 3 meals, and at bedtime.  also check if you have symptoms of your blood sugar being too high or too low.  please keep a record of the readings and bring it to your next appointment here (or you can bring the meter itself).  You can write it on any piece of paper.  please call us sooner if your blood sugar goes below 70, or if you have a lot of readings over 200.  Please decrease the NPH to 45 units each morning.   Please come back for a follow-up appointment in 1 month.

## 2015-08-14 NOTE — Patient Instructions (Addendum)
check your blood sugar twice a day.  vary the time of day when you check, between before the 3 meals, and at bedtime.  also check if you have symptoms of your blood sugar being too high or too low.  please keep a record of the readings and bring it to your next appointment here (or you can bring the meter itself).  You can write it on any piece of paper.  please call us sooner if your blood sugar goes below 70, or if you have a lot of readings over 200.  Please decrease the NPH to 45 units each morning.   Please come back for a follow-up appointment in 1 month.

## 2015-09-14 ENCOUNTER — Encounter: Payer: Self-pay | Admitting: Endocrinology

## 2015-09-14 ENCOUNTER — Ambulatory Visit (INDEPENDENT_AMBULATORY_CARE_PROVIDER_SITE_OTHER): Payer: PPO | Admitting: Endocrinology

## 2015-09-14 VITALS — BP 137/72 | HR 52 | Temp 97.7°F | Wt 201.0 lb

## 2015-09-14 DIAGNOSIS — Z794 Long term (current) use of insulin: Secondary | ICD-10-CM

## 2015-09-14 DIAGNOSIS — N183 Chronic kidney disease, stage 3 (moderate): Secondary | ICD-10-CM

## 2015-09-14 DIAGNOSIS — E1122 Type 2 diabetes mellitus with diabetic chronic kidney disease: Secondary | ICD-10-CM | POA: Diagnosis not present

## 2015-09-14 MED ORDER — INSULIN GLARGINE 100 UNIT/ML SOLOSTAR PEN: 45.0000 [IU] | PEN_INJECTOR | Freq: Every day | SUBCUTANEOUS | Status: DC

## 2015-09-14 NOTE — Patient Instructions (Addendum)
check your blood sugar twice a day.  vary the time of day when you check, between before the 3 meals, and at bedtime.  also check if you have symptoms of your blood sugar being too high or too low.  please keep a record of the readings and bring it to your next appointment here (or you can bring the meter itself).  You can write it on any piece of paper.  please call us sooner if your blood sugar goes below 70, or if you have a lot of readings over 200.  Please change the NPH to lantus, 45 units each morning.   Please come back for a follow-up appointment in 6 weeks.

## 2015-09-14 NOTE — Progress Notes (Signed)
Subjective:    Patient ID: Joshua [name], male    DOB: 1928/05/24, 82 y.o.   MRN: [id number]  HPI Pt returns for f/u of diabetes mellitus:  DM type: Insulin-requiring type 2.  Dx'ed: 1996.  Complications: polyneuropathy, CAD, retinopathy, and renal failure.   Therapy: insulin since 2014. DKA: never. Severe hypoglycemia: never.  Pancreatitis: never.   Other: Joshua [name] takes QD insulin, after poor results with multiple daily injections.   Interval history: Joshua [name] says Joshua [name] never misses the insulin. Joshua [name] takes 45 units each morning.  Meter is downloaded today, and the printout is scanned into the record.  It varies from 50-400's, but most are in the 200's.  It is lowest in the afternoon.   Past Medical History  Diagnosis Date  . Hyperlipidemia   . CKD (chronic kidney disease), stage III   . Type 2 diabetes mellitus ([place])   . BPH (benign prostatic hypertrophy)   . Hypothyroidism   . GERD (gastroesophageal reflux disease)   . H/O hiatal hernia   . Asymptomatic stenosis of right carotid artery     MILD RIGHT ICA  <40%  PER DUPLEX STUDY  2003  . Short of breath on exertion   . History of gastric ulcer   . Frequency of urination   . Nocturia   . Urgency of urination     Past Surgical History  Procedure Laterality Date  . Cardiac catheterization  01-07-2000  DR Daneen Schick    ESSENTIALLY NORMAL CORONARY ARTERIES W/ MINIMAL PLAQUING RCA & LAD/ NORMAL LVF/ SEVERE TORTUOSITY AORTOILIAC AND FEMORAL TERRITORY / FALSE ABNORMAL CARDIOLITE  . Total knee arthroplasty Bilateral LEFT  10-04-2004/   RIGHT  03-17-2008  . Lumbar disc surgery  1994  . Transurethral resection of prostate N/A 02/01/2013    Procedure: TRANSURETHRAL RESECTION OF THE PROSTATEAND TURB  WITH GYRUS INSTRUMENTS;  Surgeon: Bernestine Amass, MD;  Location: [facility];  Service: Urology;  Laterality: N/A;    Social History   Social History  . Marital Status: Married    Spouse Name: N/A  . Number of Children: N/A    . Years of Education: N/A   Occupational History  . Not on file.   Social History Main Topics  . Smoking status: Never Smoker   . Smokeless tobacco: Never Used  . Alcohol Use: No  . Drug Use: No  . Sexual Activity: Not on file   Other Topics Concern  . Not on file   Social History Narrative    Current Outpatient Prescriptions on File Prior to Visit  Medication Sig Dispense Refill  . B Complex Vitamins (B-COMPLEX/B-12) TABS Take by mouth.    . B-D ULTRAFINE III SHORT PEN 31G X 8 MM MISC See admin instructions. use with insulin pen  5  . donepezil (ARICEPT) 10 MG tablet TAKE 1 TABLET (10 MG TOTAL) BY MOUTH AT BEDTIME. 90 tablet 3  . Insulin Syringe-Needle U-100 (INSULIN SYRINGE 1CC/30GX5/16") 30G X 5/16" 1 ML MISC Use to inject insulin 2 times per day. 100 each 2  . levothyroxine (SYNTHROID, LEVOTHROID) 75 MCG tablet Take 75 mcg by mouth daily before breakfast.   3  . simvastatin (ZOCOR) 40 MG tablet Take 20 mg by mouth every evening.     No current facility-administered medications on file prior to visit.    No Known Allergies  Family History  Problem Relation Age of Onset  . Diabetes Mother   . Diabetes Father   . Heart disease  Father   . Heart disease Brother     BP 137/72 mmHg  Pulse 52  Temp(Src) 97.7 F (36.5 C) (Oral)  Wt 201 lb (91.173 kg)  SpO2 97%  Review of Systems No weight change    Objective:   Physical Exam VITAL SIGNS:  See vs page GENERAL: no distress Pulses: dorsalis pedis intact bilat.   MSK: no deformity of the feet  CV: trace bilat leg edema, and bilat varicosities.  Skin: no ulcer on the feet. normal color and temp on the feet.  Neuro: sensation is intact to touch on the feet, but decreased from normal.      Assessment & Plan:  Insulin-requiring type 2 DM: The pattern of his cbg's indicates Joshua [name] needs a slower-acting qd insulin.   Hypoglycemia: in view of CAD and advanced age, Joshua [name] should minimize this.   Patient is advised the  following: Patient Instructions  check your blood sugar twice a day.  vary the time of day when you check, between before the 3 meals, and at bedtime.  also check if you have symptoms of your blood sugar being too high or too low.  please keep a record of the readings and bring it to your next appointment here (or you can bring the meter itself).  You can write it on any piece of paper.  please call us sooner if your blood sugar goes below 70, or if you have a lot of readings over 200.  Please change the NPH to lantus, 45 units each morning.   Please come back for a follow-up appointment in 6 weeks.

## 2015-10-18 ENCOUNTER — Other Ambulatory Visit: Payer: Self-pay | Admitting: Neurology

## 2015-10-18 NOTE — Telephone Encounter (Signed)
Aricept refill denied. Needs appt.

## 2015-10-26 ENCOUNTER — Ambulatory Visit (INDEPENDENT_AMBULATORY_CARE_PROVIDER_SITE_OTHER): Payer: PPO | Admitting: Endocrinology

## 2015-10-26 ENCOUNTER — Telehealth: Payer: Self-pay | Admitting: Neurology

## 2015-10-26 ENCOUNTER — Encounter: Payer: Self-pay | Admitting: Endocrinology

## 2015-10-26 VITALS — BP 110/72 | HR 54 | Wt 202.0 lb

## 2015-10-26 DIAGNOSIS — E1122 Type 2 diabetes mellitus with diabetic chronic kidney disease: Secondary | ICD-10-CM | POA: Diagnosis not present

## 2015-10-26 DIAGNOSIS — N183 Chronic kidney disease, stage 3 unspecified: Secondary | ICD-10-CM

## 2015-10-26 DIAGNOSIS — Z794 Long term (current) use of insulin: Secondary | ICD-10-CM

## 2015-10-26 LAB — POCT GLYCOSYLATED HEMOGLOBIN (HGB A1C): Hemoglobin A1C: 9.8

## 2015-10-26 MED ORDER — INSULIN GLARGINE 100 UNIT/ML ~~LOC~~ SOLN: 45.0000 [IU] | Freq: Every day | SUBCUTANEOUS | Status: DC

## 2015-10-26 NOTE — Progress Notes (Signed)
Subjective:    Patient ID: Joshua [name], male    DOB: [birth year], 82 y.o.   MRN: [id number]  HPI Pt returns for f/u of diabetes mellitus:  DM type: Insulin-requiring type 2.  Dx'ed: 1996.  Complications: polyneuropathy, CAD, retinopathy, and renal failure.   Therapy: insulin since 2014. DKA: never. Severe hypoglycemia: never.  Pancreatitis: never.   Other: he takes QD insulin, after poor results with multiple daily injections.   Interval history: He has not yet changed to lantus.  Meter is downloaded today, and the printout is scanned into the record.  It varies from 80-500, but most are in the high-100's.  It is lowest in the afternoon.  dtr prefers syringe and vial, as she draws up for him..   Past Medical History  Diagnosis Date  . Hyperlipidemia   . CKD (chronic kidney disease), stage III   . Type 2 diabetes mellitus ([place])   . BPH (benign prostatic hypertrophy)   . Hypothyroidism   . GERD (gastroesophageal reflux disease)   . H/O hiatal hernia   . Asymptomatic stenosis of right carotid artery     MILD RIGHT ICA  <40%  PER DUPLEX STUDY  2003  . Short of breath on exertion   . History of gastric ulcer   . Frequency of urination   . Nocturia   . Urgency of urination     Past Surgical History  Procedure Laterality Date  . Cardiac catheterization  01-07-2000  DR Daneen Schick    ESSENTIALLY NORMAL CORONARY ARTERIES W/ MINIMAL PLAQUING RCA & LAD/ NORMAL LVF/ SEVERE TORTUOSITY AORTOILIAC AND FEMORAL TERRITORY / FALSE ABNORMAL CARDIOLITE  . Total knee arthroplasty Bilateral LEFT  10-04-2004/   RIGHT  03-17-2008  . Lumbar disc surgery  1994  . Transurethral resection of prostate N/A 02/01/2013    Procedure: TRANSURETHRAL RESECTION OF THE PROSTATEAND TURB  WITH GYRUS INSTRUMENTS;  Surgeon: Bernestine Amass, MD;  Location: [facility];  Service: Urology;  Laterality: N/A;    Social History   Social History  . Marital Status: Married    Spouse Name: N/A  .  Number of Children: N/A  . Years of Education: N/A   Occupational History  . Not on file.   Social History Main Topics  . Smoking status: Never Smoker   . Smokeless tobacco: Never Used  . Alcohol Use: No  . Drug Use: No  . Sexual Activity: Not on file   Other Topics Concern  . Not on file   Social History Narrative    Current Outpatient Prescriptions on File Prior to Visit  Medication Sig Dispense Refill  . B Complex Vitamins (B-COMPLEX/B-12) TABS Take by mouth.    . B-D ULTRAFINE III SHORT PEN 31G X 8 MM MISC See admin instructions. use with insulin pen  5  . donepezil (ARICEPT) 10 MG tablet TAKE 1 TABLET (10 MG TOTAL) BY MOUTH AT BEDTIME. 90 tablet 3  . Insulin Syringe-Needle U-100 (INSULIN SYRINGE 1CC/30GX5/16") 30G X 5/16" 1 ML MISC Use to inject insulin 2 times per day. 100 each 2  . levothyroxine (SYNTHROID, LEVOTHROID) 75 MCG tablet Take 75 mcg by mouth daily before breakfast.   3  . simvastatin (ZOCOR) 40 MG tablet Take 20 mg by mouth every evening.     No current facility-administered medications on file prior to visit.    No Known Allergies  Family History  Problem Relation Age of Onset  . Diabetes Mother   . Diabetes Father   .  Heart disease Father   . Heart disease Brother     BP 110/72 mmHg  Pulse 54  Wt 202 lb (91.627 kg)  Review of Systems He denies hypoglycemia.     Objective:   Physical Exam VITAL SIGNS:  See vs page.  GENERAL: no distress.  Pulses: dorsalis pedis intact bilat.   MSK: no deformity of the feet  CV: trace bilat leg edema, and bilat varicosities.  Skin: no ulcer on the feet. normal color and temp on the feet.  Neuro: sensation is intact to touch on the feet, but decreased from normal.   A1c=9.8%    Assessment & Plan:  Insulin-requiring type 2 DM: The pattern of his cbg's indicates he needs a slower-acting qd insulin.   Patient is advised the following: Patient Instructions  check your blood sugar twice a day.  vary  the time of day when you check, between before the 3 meals, and at bedtime.  also check if you have symptoms of your blood sugar being too high or too low.  please keep a record of the readings and bring it to your next appointment here (or you can bring the meter itself).  You can write it on any piece of paper.  please call us sooner if your blood sugar goes below 70, or if you have a lot of readings over 200.  Please change the NPH to lantus, 45 units each morning.   Please come back for a follow-up appointment in 6 weeks.     Renato Shin, MD

## 2015-10-26 NOTE — Telephone Encounter (Signed)
Joshua [name] 12/08/1928.  He and his daughter came by needing a refill on Donepezil 10 mg. He uses [facility] at [place]. His number is [phone]. Thank you

## 2015-10-26 NOTE — Patient Instructions (Addendum)
check your blood sugar twice a day.  vary the time of day when you check, between before the 3 meals, and at bedtime.  also check if you have symptoms of your blood sugar being too high or too low.  please keep a record of the readings and bring it to your next appointment here (or you can bring the meter itself).  You can write it on any piece of paper.  please call us sooner if your blood sugar goes below 70, or if you have a lot of readings over 200.  Please change the NPH to lantus, 45 units each morning.   Please come back for a follow-up appointment in 6 weeks.

## 2015-10-26 NOTE — Telephone Encounter (Signed)
Made aware needs follow up. Appt made with patient.

## 2015-11-03 ENCOUNTER — Ambulatory Visit (INDEPENDENT_AMBULATORY_CARE_PROVIDER_SITE_OTHER): Payer: PPO | Admitting: Neurology

## 2015-11-03 ENCOUNTER — Encounter: Payer: Self-pay | Admitting: Neurology

## 2015-11-03 VITALS — BP 120/60 | HR 60 | Ht 68.0 in | Wt 202.0 lb

## 2015-11-03 DIAGNOSIS — F0150 Vascular dementia without behavioral disturbance: Secondary | ICD-10-CM

## 2015-11-03 DIAGNOSIS — E538 Deficiency of other specified B group vitamins: Secondary | ICD-10-CM

## 2015-11-03 MED ORDER — DONEPEZIL HCL 10 MG PO TABS: 10.0000 mg | ORAL_TABLET | Freq: Every day | ORAL | Status: DC

## 2015-11-03 NOTE — Progress Notes (Signed)
Joshua [name] was seen today in the movement disorders clinic for neurologic consultation at the request of Simona Huh, MD.  The consultation is for the evaluation of gait and memory changes.  Pt is accompanied by his daughter who is his POA and wife, who supplement the history.  His daughter brought in several notes for me to review prior to his visit.  Her biggest concern is the patient's memory loss and that started about 1 year ago and that got worse over the last year.  The memory seems to fluctuate.  The patients wife has Parkinson's disease and they are the only 2 in the home.  However, the patient is not able to assist much with the caregiving of his wife, who is still very functional.  He will get dressed and then forget that he has gotten dressed and try to put on another pair of pants on top of the first pair that he just put on.  Recently, he got his shaving cream mixed up with soap and sprayed shaving cream all over the bathroom.  He is still driving, and he will forget where to go.  He admits that he "messes up even in familiar places."   The pt has lived in the same home with his wife for 43 years.  They live in a one story home.  His wife has always taken care of finances.  He takes care of distributing his own medications but last week they got him his own pill box that his wife is preparing.  She wasn't sure that the medications were getting taken properly.  There are no word finding troubles.  No appetite troubles.  Pt admits that he sleeps most of the day.  Until December, he was working and since then, he really hasn't gotten into any type of routine.  There are no hallucinations.    01/31/14 update:  Pt is f/u today.  No one accompanies him back to the room.  I started the patient on aricept last visit.  The patient reports that he is doing well on this medication.  The patient did have his thyroid rechecked by his primary care physician since last visit as his TSH was  elevated at 5.16 last visit, but after treatment it had improved and TSH in June, 2015 was now 0.91.  However, his hemoglobin A1c in June was 9.2 and this was repeated on 12/29/2013 and was 9.3.  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  I set him up with physical therapy.  His B12 has been a little low and he admits that he only takes the oral supplement about 2-3 times per week.    08/01/14 update:  Patient is following up today for his vascular dementia.  He has a history of B12 deficiency.  We recheck this last visit and it was normal at 485 but he has since quit taking his B12.  He is on Aricept and is doing well with this medication.  He and his wife feel that he has been stable in terms of memory. He physically helps take care of his wife, who has PD.  His DM has not been well controlled  11/03/15 update:  The patient returns today, accompanied by his wife who supplements the history.  I have reviewed his records made available to me.  I have not seen him in about 15 months.  He has  a history of vascular dementia.  He is on Aricept.  He reports that he is managing his meds except insulin.  His daughter is helping draw his insulin up.  Last visit, I asked him to restart his oral B12 and he states that he is doing that.  I also told him that he needed to stop driving last visit unless he passed an occupational driving evaluation, which was not completed.  He reports that he is driving still.  His wife doesn't drive.  Reports that his daughter lives 800 feet behind his home.  Wife does the cooking some, daughter does some.  Wife does finances.  Wife reports that she is fairly healthy.     Neuroimaging has  previously been performed.  It is available for my review today.  A CT of the brain was performed in 2012 demonstrating atrophy  PREVIOUS MEDICATIONS: none to date  ALLERGIES:  No Known Allergies  CURRENT  MEDICATIONS:  Current Outpatient Prescriptions on File Prior to Visit  Medication Sig Dispense Refill  . B Complex Vitamins (B-COMPLEX/B-12) TABS Take by mouth.    . B-D ULTRAFINE III SHORT PEN 31G X 8 MM MISC See admin instructions. use with insulin pen  5  . donepezil (ARICEPT) 10 MG tablet TAKE 1 TABLET (10 MG TOTAL) BY MOUTH AT BEDTIME. 90 tablet 3  . insulin glargine (LANTUS) 100 UNIT/ML injection Inject 0.45 mLs (45 Units total) into the skin at bedtime. And syringes 1/day 10 mL 11  . levothyroxine (SYNTHROID, LEVOTHROID) 75 MCG tablet Take 75 mcg by mouth daily before breakfast.   3  . simvastatin (ZOCOR) 40 MG tablet Take 20 mg by mouth every evening.    . Insulin Syringe-Needle U-100 (INSULIN SYRINGE 1CC/30GX5/16") 30G X 5/16" 1 ML MISC Use to inject insulin 2 times per day. (Patient not taking: Reported on 11/03/2015) 100 each 2  . ONE TOUCH ULTRA TEST test strip Reported on 11/03/2015  5   No current facility-administered medications on file prior to visit.    PAST MEDICAL HISTORY:   Past Medical History  Diagnosis Date  . Hyperlipidemia   . CKD (chronic kidney disease), stage III   . Type 2 diabetes mellitus ([place])   . BPH (benign prostatic hypertrophy)   . Hypothyroidism   . GERD (gastroesophageal reflux disease)   . H/O hiatal hernia   . Asymptomatic stenosis of right carotid artery     MILD RIGHT ICA  <40%  PER DUPLEX STUDY  2003  . Short of breath on exertion   . History of gastric ulcer   . Frequency of urination   . Nocturia   . Urgency of urination     PAST SURGICAL HISTORY:   Past Surgical History  Procedure Laterality Date  . Cardiac catheterization  01-07-2000  DR Daneen Schick    ESSENTIALLY NORMAL CORONARY ARTERIES W/ MINIMAL PLAQUING RCA & LAD/ NORMAL LVF/ SEVERE TORTUOSITY AORTOILIAC AND FEMORAL TERRITORY / FALSE ABNORMAL CARDIOLITE  . Total knee arthroplasty Bilateral LEFT  10-04-2004/   RIGHT  03-17-2008  . Lumbar disc surgery  1994  . Transurethral  resection of prostate N/A 02/01/2013    Procedure: TRANSURETHRAL RESECTION OF THE PROSTATEAND TURB  WITH GYRUS INSTRUMENTS;  Surgeon: Bernestine Amass, MD;  Location: [facility];  Service: Urology;  Laterality: N/A;    SOCIAL HISTORY:   Social History   Social History  . Marital Status: Married    Spouse Name: N/A  . Number of Children:  N/A  . Years of Education: N/A   Occupational History  . Not on file.   Social History Main Topics  . Smoking status: Never Smoker   . Smokeless tobacco: Never Used  . Alcohol Use: No  . Drug Use: No  . Sexual Activity: Not on file   Other Topics Concern  . Not on file   Social History Narrative    FAMILY HISTORY:   Family Status  Relation Status Death Age  . Mother Deceased     diabetes  . Father Deceased     heart disease, diabetes  . Sister Alive   . Brother Alive   . Daughter Alive   . Son Alive   . Maternal Grandmother Deceased   . Maternal Grandfather Deceased   . Paternal Grandmother Deceased   . Paternal Grandfather Deceased   . Brother Alive   . Brother Alive   . Brother Deceased     heart disease    ROS:  A complete 10 system review of systems was obtained and was unremarkable apart from what is mentioned above.  PHYSICAL EXAMINATION:    VITALS:   Filed Vitals:   11/03/15 0928  BP: 120/60  Pulse: 60  Height: 5\' 8"  (1.727 m)  Weight: 202 lb (91.627 kg)    GEN:  The patient appears stated age and is in NAD. HEENT:  Normocephalic, atraumatic.  The mucous membranes are moist. The superficial temporal arteries are without ropiness or tenderness. CV:  RRR Lungs:  CTAB Neck/HEME:  There are no carotid bruits bilaterally.  Neurological examination:  Orientation:  Montreal Cognitive Assessment  11/03/2015 08/01/2014  Visuospatial/ Executive (0/5) 1 2  Naming (0/3) 2 2  Attention: Read list of digits (0/2) 1 1  Attention: Read list of letters (0/1) 1 1  Attention: Serial 7 subtraction starting at  100 (0/3) 0 1  Language: Repeat phrase (0/2) 2 2  Language : Fluency (0/1) 0 0  Abstraction (0/2) 0 0  Delayed Recall (0/5) 0 0  Orientation (0/6) 5 5  Total 12 14  Adjusted Score (based on education) 13 15    Sensation: Sensation is intact to light and pinprick throughout (facial, trunk, extremities). Vibration is decreased at the bilateral big toe. There is no extinction with double simultaneous stimulation. There is no sensory dermatomal level identified. Motor: Strength is 5/5 in the bilateral upper and lower extremities.   Shoulder shrug is equal and symmetric.  There is no pronator drift. Frontal release signs: There is a bilateral palmomental response.  There is a positive glabellar tap (Myerson sign).  No rooting reflex.  Movement examination: Tone: There is no increased tone in the upper extremities/LE Abnormal movements: There was a mild tremor of the outstretched hands Coordination:  There is no significant decremation with RAM's Gait and Station: The patient has minimal difficulty arising out of a deep-seated chair without the use of the hands.  He was able to arise without the use of the hands on the second attempt.  The patient's stride length is decreased, with a wide-based gait.    ASSESSMENT/PLAN:  1.  Memory loss  -I do think that this is multifactorial, but think the primary etiology is vascular dementia.  He is doing well on aricept and he can continue that.   -We talked again about safety. We talked about the importance of medication monitoring.  We talked about driving.  I do not want him to drive, unless he passes an occupational therapy driving  evaluation.  Right now, he was not willing to do that. His MoCA, as expected, continues to decline and he performs poorly on trail making, which is a predictor of driving.  In my medical opinion, he should not drive.  -per chart, daughter monitors insulin but pt reports he does other meds.  Would prefer that all meds are  monitored. 2.  gait changes.  -I do think he has evidence of vascular parkinsonism.  I do not think he has idiopathic Parkinson's disease.  We talked about the differences.  He has had no falls 3.  B12 deficiency  -Is on oral B12 supplements 4.  F/u 1 year. Much greater than 50% of this visit was spent in counseling and coordinating care.  Total face to face time:  25 min

## 2015-11-15 ENCOUNTER — Other Ambulatory Visit: Payer: Self-pay

## 2015-11-15 ENCOUNTER — Telehealth: Payer: Self-pay | Admitting: Endocrinology

## 2015-11-15 MED ORDER — INSULIN GLARGINE 100 UNIT/ML ~~LOC~~ SOLN: 45.0000 [IU] | Freq: Every day | SUBCUTANEOUS | 11 refills | Status: DC

## 2015-11-15 NOTE — Telephone Encounter (Signed)
lantus rx needs to be called into [facility] on [address] 45 u per dose Daughter Santiago Glad # [phone]

## 2015-11-22 ENCOUNTER — Other Ambulatory Visit: Payer: Self-pay | Admitting: Pharmacist

## 2015-11-22 NOTE — Patient Outreach (Signed)
Outreach call to [facility] regarding his request for follow up from the [facility] Medication Adherence Campaign. Called and spoke with patient. HIPAA identifiers verified and verbal consent received.  Mr. Trussel reports that he does not remember all of the names of his medications. Reports that his wife helps him to manage his medications by picking up refills and filling his pillbox each week. Mr and Mrs. Robar review his pill bottles and find the simvastatin. Discuss with the patient the importance of taking this medication to lower his cholesterol. Patient reports that he has been taking it daily. Patient denies his wife having had any difficulty with getting it from the pharmacy and he denies missing any doses. Looking at the bottle, patient reports that it was last filled on 10/19/15 for a 90 day supply. Patient reports that it looks like about 1/3 of the pills are gone. Reports that he takes 1 tablet (40 mg) daily as directed on the bottle.  Patient denies any medication questions or concerns at this time. Patient declines to take my phone number.  Harlow Asa, PharmD Clinical Pharmacist [facility] [phone]

## 2015-12-07 ENCOUNTER — Ambulatory Visit (INDEPENDENT_AMBULATORY_CARE_PROVIDER_SITE_OTHER): Payer: PPO | Admitting: Endocrinology

## 2015-12-07 VITALS — BP 128/62 | Ht 68.0 in | Wt 207.0 lb

## 2015-12-07 DIAGNOSIS — E1122 Type 2 diabetes mellitus with diabetic chronic kidney disease: Secondary | ICD-10-CM | POA: Diagnosis not present

## 2015-12-07 DIAGNOSIS — Z794 Long term (current) use of insulin: Secondary | ICD-10-CM

## 2015-12-07 DIAGNOSIS — N183 Chronic kidney disease, stage 3 unspecified: Secondary | ICD-10-CM

## 2015-12-07 MED ORDER — INSULIN GLARGINE 100 UNIT/ML ~~LOC~~ SOLN: 40.0000 [IU] | SUBCUTANEOUS | 11 refills | Status: DC

## 2015-12-07 NOTE — Telephone Encounter (Signed)
See message and please advise, Thanks!  

## 2015-12-07 NOTE — Progress Notes (Signed)
Subjective:    Patient ID: Joshua [name], male    DOB: Sep 05, 1928, 82 y.o.   MRN: [id number]  HPI Pt returns for f/u of diabetes mellitus:  DM type: Insulin-requiring type 2.  Dx'ed: 1996.  Complications: polyneuropathy, CAD, retinopathy, and renal failure.   Therapy: insulin since 2014. DKA: never. Severe hypoglycemia: never.  Pancreatitis: never.   Other: he takes QD insulin, after poor results with multiple daily injections; on AM NPH, he had afternoon hypoglycemia.  dtr prefers syringe and vial, as she draws up for him.   Interval history: He reports fatigue.  Meter is downloaded today, and the printout is scanned into the record.  It varies from 70-400.  It is in general higher as the day goes on  Past Medical History:  Diagnosis Date  . Asymptomatic stenosis of right carotid artery    MILD RIGHT ICA  <40%  PER DUPLEX STUDY  2003  . BPH (benign prostatic hypertrophy)   . CKD (chronic kidney disease), stage III   . Frequency of urination   . GERD (gastroesophageal reflux disease)   . H/O hiatal hernia   . History of gastric ulcer   . Hyperlipidemia   . Hypothyroidism   . Nocturia   . Short of breath on exertion   . Type 2 diabetes mellitus ([place])   . Urgency of urination     Past Surgical History:  Procedure Laterality Date  . CARDIAC CATHETERIZATION  01-07-2000  DR Daneen Schick   ESSENTIALLY NORMAL CORONARY ARTERIES W/ MINIMAL PLAQUING RCA & LAD/ NORMAL LVF/ SEVERE TORTUOSITY AORTOILIAC AND FEMORAL TERRITORY / FALSE ABNORMAL CARDIOLITE  . LUMBAR [place]  . TOTAL KNEE ARTHROPLASTY Bilateral LEFT  10-04-2004/   RIGHT  03-17-2008  . TRANSURETHRAL RESECTION OF PROSTATE N/A 02/01/2013   Procedure: TRANSURETHRAL RESECTION OF THE PROSTATEAND TURB  WITH GYRUS INSTRUMENTS;  Surgeon: Bernestine Amass, MD;  Location: [facility];  Service: Urology;  Laterality: N/A;    Social History   Social History  . Marital status: Married    Spouse name: N/A    . Number of children: N/A  . Years of education: N/A   Occupational History  . Not on file.   Social History Main Topics  . Smoking status: Never Smoker  . Smokeless tobacco: Never Used  . Alcohol use No  . Drug use: No  . Sexual activity: Not on file   Other Topics Concern  . Not on file   Social History Narrative  . No narrative on file    Current Outpatient Prescriptions on File Prior to Visit  Medication Sig Dispense Refill  . B Complex Vitamins (B-COMPLEX/B-12) TABS Take by mouth.    . B-D ULTRAFINE III SHORT PEN 31G X 8 MM MISC See admin instructions. use with insulin pen  5  . donepezil (ARICEPT) 10 MG tablet Take 1 tablet (10 mg total) by mouth at bedtime. 90 tablet 3  . Insulin Syringe-Needle U-100 (INSULIN SYRINGE 1CC/30GX5/16") 30G X 5/16" 1 ML MISC Use to inject insulin 2 times per day. 100 each 2  . levothyroxine (SYNTHROID, LEVOTHROID) 75 MCG tablet Take 75 mcg by mouth daily before breakfast.   3  . ONE TOUCH ULTRA TEST test strip Reported on 11/03/2015  5  . simvastatin (ZOCOR) 40 MG tablet Take 20 mg by mouth every evening.     No current facility-administered medications on file prior to visit.     No Known Allergies  Family History  Problem Relation Age of Onset  . Diabetes Mother   . Diabetes Father   . Heart disease Father   . Heart disease Brother     BP 128/62   Ht 5\' 8"  (1.727 m)   Wt 207 lb (93.9 kg)   BMI 31.47 kg/m    Review of Systems Denies LOC.      Objective:   Physical Exam VITAL SIGNS:  See vs page.  GENERAL: no distress.  Pulses: dorsalis pedis intact bilat. MSK: no deformity of the feet.  CV: 1+ bilat leg edema, and bilat vv's.  Skin: no ulcer on the feet. normal color and temp on the feet.  Neuro: sensation is intact to touch on the feet, but decreased from normal.       Assessment & Plan:  Insulin-requiring type 2 DM: overcontrolled.  CAD: in this setting, he should avoid hypoglycemia. Renal failure: this  causes long duration of action of insulin.

## 2015-12-07 NOTE — Telephone Encounter (Signed)
For the change made in the visit today to take insulin in AM now instead of PM he took the PM dose last night before bed so what does he do for today? How do they transition?  # [phone] Joshua [name] daughter

## 2015-12-07 NOTE — Telephone Encounter (Signed)
I contacted the patient's daughter and advised of MD's instructions. She voiced understanding and had no further questions.

## 2015-12-07 NOTE — Telephone Encounter (Signed)
This evening, take 1/2 of usual amount.  Then tomorrow AM, start am insulin

## 2015-12-07 NOTE — Patient Instructions (Addendum)
check your blood sugar twice a day.  vary the time of day when you check, between before the 3 meals, and at bedtime.  also check if you have symptoms of your blood sugar being too high or too low.  please keep a record of the readings and bring it to your next appointment here (or you can bring the meter itself).  You can write it on any piece of paper.  please call us sooner if your blood sugar goes below 70, or if you have a lot of readings over 200.  Please reduce the lantus to 40 units each morning.   Please come back for a follow-up appointment in 2 months.

## 2016-02-06 ENCOUNTER — Ambulatory Visit (INDEPENDENT_AMBULATORY_CARE_PROVIDER_SITE_OTHER): Payer: PPO | Admitting: Endocrinology

## 2016-02-06 ENCOUNTER — Encounter: Payer: Self-pay | Admitting: Endocrinology

## 2016-02-06 VITALS — BP 124/62 | HR 63 | Ht 68.0 in | Wt 203.0 lb

## 2016-02-06 DIAGNOSIS — E1122 Type 2 diabetes mellitus with diabetic chronic kidney disease: Secondary | ICD-10-CM | POA: Diagnosis not present

## 2016-02-06 DIAGNOSIS — N183 Chronic kidney disease, stage 3 unspecified: Secondary | ICD-10-CM

## 2016-02-06 DIAGNOSIS — Z23 Encounter for immunization: Secondary | ICD-10-CM | POA: Diagnosis not present

## 2016-02-06 DIAGNOSIS — Z794 Long term (current) use of insulin: Secondary | ICD-10-CM | POA: Diagnosis not present

## 2016-02-06 LAB — POCT GLYCOSYLATED HEMOGLOBIN (HGB A1C): HEMOGLOBIN A1C: 11.3

## 2016-02-06 NOTE — Patient Instructions (Addendum)
check your blood sugar twice a day.  vary the time of day when you check, between before the 3 meals, and at bedtime.  also check if you have symptoms of your blood sugar being too high or too low.  please keep a record of the readings and bring it to your next appointment here (or you can bring the meter itself).  You can write it on any piece of paper.  please call us sooner if your blood sugar goes below 70, or if you have a lot of readings over 200.  The next step is to see why the blood sugar is lower on some days than others Please continue the same insulin.   Please come back for a follow-up appointment in 3 months.   Please call us in 2 weeks, to tell us why the blood sugar is lower on some days.

## 2016-02-06 NOTE — Progress Notes (Signed)
Subjective:    Patient ID: Joshua [name], male    DOB: [birth year], 82 y.o.   MRN: [id number]  HPI Pt returns for f/u of diabetes mellitus:  DM type: Insulin-requiring type 2.  Dx'ed: 1996.  Complications: polyneuropathy, CAD, retinopathy, and renal failure.   Therapy: insulin since 2014. DKA: never. Severe hypoglycemia: never.  Pancreatitis: never.   Other: he takes QD insulin, after poor results with multiple daily injections; on AM NPH, he had afternoon hypoglycemia.  dtr prefers syringe and vial, as she draws up for him, and pt administers to himself.  Pt check his own cbg's.  Pt says his diet varies very little.   Interval history: dtr says he never misses the insulin.  Meter is downloaded today, and the printout is scanned into the record.  It varies from 90-360.  There is no trend throughout the day.   Past Medical History:  Diagnosis Date  . Asymptomatic stenosis of right carotid artery    MILD RIGHT ICA  <40%  PER DUPLEX STUDY  2003  . BPH (benign prostatic hypertrophy)   . CKD (chronic kidney disease), stage III   . Frequency of urination   . GERD (gastroesophageal reflux disease)   . H/O hiatal hernia   . History of gastric ulcer   . Hyperlipidemia   . Hypothyroidism   . Nocturia   . Short of breath on exertion   . Type 2 diabetes mellitus ([place])   . Urgency of urination     Past Surgical History:  Procedure Laterality Date  . CARDIAC CATHETERIZATION  01-07-2000  DR Daneen Schick   ESSENTIALLY NORMAL CORONARY ARTERIES W/ MINIMAL PLAQUING RCA & LAD/ NORMAL LVF/ SEVERE TORTUOSITY AORTOILIAC AND FEMORAL TERRITORY / FALSE ABNORMAL CARDIOLITE  . LUMBAR [place]  . TOTAL KNEE ARTHROPLASTY Bilateral LEFT  10-04-2004/   RIGHT  03-17-2008  . TRANSURETHRAL RESECTION OF PROSTATE N/A 02/01/2013   Procedure: TRANSURETHRAL RESECTION OF THE PROSTATEAND TURB  WITH GYRUS INSTRUMENTS;  Surgeon: Bernestine Amass, MD;  Location: [facility];  Service: Urology;   Laterality: N/A;    Social History   Social History  . Marital status: Married    Spouse name: N/A  . Number of children: N/A  . Years of education: N/A   Occupational History  . Not on file.   Social History Main Topics  . Smoking status: Never Smoker  . Smokeless tobacco: Never Used  . Alcohol use No  . Drug use: No  . Sexual activity: Not on file   Other Topics Concern  . Not on file   Social History Narrative  . No narrative on file    Current Outpatient Prescriptions on File Prior to Visit  Medication Sig Dispense Refill  . B Complex Vitamins (B-COMPLEX/B-12) TABS Take by mouth.    . B-D ULTRAFINE III SHORT PEN 31G X 8 MM MISC See admin instructions. use with insulin pen  5  . donepezil (ARICEPT) 10 MG tablet Take 1 tablet (10 mg total) by mouth at bedtime. 90 tablet 3  . insulin glargine (LANTUS) 100 UNIT/ML injection Inject 0.4 mLs (40 Units total) into the skin every morning. And syringes 1/day 20 mL 11  . Insulin Syringe-Needle U-100 (INSULIN SYRINGE 1CC/30GX5/16") 30G X 5/16" 1 ML MISC Use to inject insulin 2 times per day. 100 each 2  . levothyroxine (SYNTHROID, LEVOTHROID) 75 MCG tablet Take 75 mcg by mouth daily before breakfast.   3  . ONE  TOUCH ULTRA TEST test strip Reported on 11/03/2015  5  . simvastatin (ZOCOR) 40 MG tablet Take 20 mg by mouth every evening.     No current facility-administered medications on file prior to visit.     No Known Allergies  Family History  Problem Relation Age of Onset  . Diabetes Mother   . Diabetes Father   . Heart disease Father   . Heart disease Brother     BP 124/62   Pulse 63   Ht 5\' 8"  (1.727 m)   Wt 203 lb (92.1 kg)   SpO2 95%   BMI 30.87 kg/m   Review of Systems He denies hypoglycemia.      Objective:   Physical Exam VITAL SIGNS:  See vs page.  GENERAL: no distress.  Pulses: dorsalis pedis intact bilat. MSK: no deformity of the feet.  CV: trace bilat leg edema, and bilat vv's.  Skin: no  ulcer on the feet. normal color and temp on the feet.  Neuro: sensation is intact to touch on the feet, but decreased from normal.    A1c=11.3%    Assessment & Plan:  Insulin-requiring type 2 DM, with renal failure, worse: We need more info to safely adjust insulin.  We discussed the need to find out why cbg is so much higher on some days than others frail elderly state: in this situation, we are limited by he fact that dtr is not there all day.  Patient is advised the following: Patient Instructions  check your blood sugar twice a day.  vary the time of day when you check, between before the 3 meals, and at bedtime.  also check if you have symptoms of your blood sugar being too high or too low.  please keep a record of the readings and bring it to your next appointment here (or you can bring the meter itself).  You can write it on any piece of paper.  please call us sooner if your blood sugar goes below 70, or if you have a lot of readings over 200.  The next step is to see why the blood sugar is lower on some days than others Please continue the same insulin.   Please come back for a follow-up appointment in 3 months.   Please call us in 2 weeks, to tell us why the blood sugar is lower on some days.

## 2016-03-18 ENCOUNTER — Telehealth: Payer: Self-pay | Admitting: Endocrinology

## 2016-03-18 NOTE — Telephone Encounter (Signed)
BS is running high up to 300-500 some days. Daughter is going into the hospital tomorrow so if the pt needs to be seen then we need to try to get him in today.   Please call her Santiago Glad # [phone]

## 2016-03-19 NOTE — Telephone Encounter (Signed)
See message and please advise on how to proceed. I was not in the office on 03/18/2016 and was not able to fwd the message till 03/19/2016.

## 2016-03-19 NOTE — Telephone Encounter (Signed)
Patient can not come to the office due his daughter being in the hospital.  Please advise, Thanks!

## 2016-03-19 NOTE — Telephone Encounter (Signed)
I contacted the patient and advised of message via voicemail. Requested a call back if the patient would like to further discuss.  

## 2016-03-19 NOTE — Telephone Encounter (Signed)
Please increase lantus to 50 units qam

## 2016-03-19 NOTE — Telephone Encounter (Signed)
2:45 today 

## 2016-04-16 DIAGNOSIS — H353221 Exudative age-related macular degeneration, left eye, with active choroidal neovascularization: Secondary | ICD-10-CM | POA: Diagnosis not present

## 2016-05-05 NOTE — Progress Notes (Signed)
Subjective:    Patient ID: Joshua [name], male    DOB: 06/09/1928, 83 y.o.   MRN: [id number]  HPI Pt returns for f/u of diabetes mellitus:  DM type: Insulin-requiring type 2.  Dx'ed: 1996.  Complications: polyneuropathy, CAD, retinopathy, and renal failure.   Therapy: insulin since 2014. DKA: never. Severe hypoglycemia: never.  Pancreatitis: never.   Other: he takes QD insulin, after poor results with multiple daily injections; on AM NPH, he had afternoon hypoglycemia.  dtr prefers syringe and vial, as she draws up for him, and pt administers to himself.  Pt check his own cbg's.  Pt says his diet varies very little.   Interval history: dtr says he never misses the insulin.  Meter is downloaded today, and the printout is scanned into the record.  It varies from 90-360.  All but one are checked fasting.   Past Medical History:  Diagnosis Date  . Asymptomatic stenosis of right carotid artery    MILD RIGHT ICA  <40%  PER DUPLEX STUDY  2003  . BPH (benign prostatic hypertrophy)   . CKD (chronic kidney disease), stage III   . Frequency of urination   . GERD (gastroesophageal reflux disease)   . H/O hiatal hernia   . History of gastric ulcer   . Hyperlipidemia   . Hypothyroidism   . Nocturia   . Short of breath on exertion   . Type 2 diabetes mellitus ([place])   . Urgency of urination     Past Surgical History:  Procedure Laterality Date  . CARDIAC CATHETERIZATION  01-07-2000  DR Daneen Schick   ESSENTIALLY NORMAL CORONARY ARTERIES W/ MINIMAL PLAQUING RCA & LAD/ NORMAL LVF/ SEVERE TORTUOSITY AORTOILIAC AND FEMORAL TERRITORY / FALSE ABNORMAL CARDIOLITE  . LUMBAR [place]  . TOTAL KNEE ARTHROPLASTY Bilateral LEFT  10-04-2004/   RIGHT  03-17-2008  . TRANSURETHRAL RESECTION OF PROSTATE N/A 02/01/2013   Procedure: TRANSURETHRAL RESECTION OF THE PROSTATEAND TURB  WITH GYRUS INSTRUMENTS;  Surgeon: Bernestine Amass, MD;  Location: [facility];  Service: Urology;   Laterality: N/A;    Social History   Social History  . Marital status: Married    Spouse name: N/A  . Number of children: N/A  . Years of education: N/A   Occupational History  . Not on file.   Social History Main Topics  . Smoking status: Never Smoker  . Smokeless tobacco: Never Used  . Alcohol use No  . Drug use: No  . Sexual activity: Not on file   Other Topics Concern  . Not on file   Social History Narrative  . No narrative on file    Current Outpatient Prescriptions on File Prior to Visit  Medication Sig Dispense Refill  . B Complex Vitamins (B-COMPLEX/B-12) TABS Take by mouth.    . B-D ULTRAFINE III SHORT PEN 31G X 8 MM MISC See admin instructions. use with insulin pen  5  . donepezil (ARICEPT) 10 MG tablet Take 1 tablet (10 mg total) by mouth at bedtime. 90 tablet 3  . Insulin Syringe-Needle U-100 (INSULIN SYRINGE 1CC/30GX5/16") 30G X 5/16" 1 ML MISC Use to inject insulin 2 times per day. 100 each 2  . levothyroxine (SYNTHROID, LEVOTHROID) 75 MCG tablet Take 75 mcg by mouth daily before breakfast.   3  . ONE TOUCH ULTRA TEST test strip Reported on 11/03/2015  5  . simvastatin (ZOCOR) 40 MG tablet Take 20 mg by mouth every evening.  No current facility-administered medications on file prior to visit.     No Known Allergies  Family History  Problem Relation Age of Onset  . Diabetes Mother   . Diabetes Father   . Heart disease Father   . Heart disease Brother     BP 126/74   Pulse 60   Ht 5\' 8"  (1.727 m)   Wt 204 lb (92.5 kg)   SpO2 95%   BMI 31.02 kg/m   Review of Systems He denies hypoglycemia    Objective:   Physical Exam VITAL SIGNS:  See vs page.  GENERAL: no distress.  Pulses: dorsalis pedis intact bilat. MSK: no deformity of the feet.  CV: trace bilat leg edema, and bilat vv's.  Skin: no ulcer on the feet. normal color and temp on the feet.  Neuro: sensation is intact to touch on the feet, but decreased from normal.    A1c=11.6% Lab Results  Component Value Date   CREATININE 1.53 (H) 02/02/2013   BUN 19 02/02/2013   NA 139 02/02/2013   K 4.4 02/02/2013   CL 106 02/02/2013   CO2 27 02/02/2013       Assessment & Plan:  Insulin-requiring type 2 DM, with retinopathy, worse frail elderly state: in this situation, we are limited by he fact that dtr is not there all day. Renal failure: this increases the risk of hypoglycemia.    Patient is advised the following: Patient Instructions  check your blood sugar twice a day.  vary the time of day when you check, between before the 3 meals, and at bedtime.  also check if you have symptoms of your blood sugar being too high or too low.  please keep a record of the readings and bring it to your next appointment here (or you can bring the meter itself).  You can write it on any piece of paper.  please call us sooner if your blood sugar goes below 70, or if you have a lot of readings over 200.  The next step is to see why the blood sugar is lower on some days than others Please increase insulin to 55 units each morning.   Please come back for a follow-up appointment in 2 months.

## 2016-05-08 ENCOUNTER — Encounter: Payer: Self-pay | Admitting: Endocrinology

## 2016-05-08 ENCOUNTER — Ambulatory Visit (INDEPENDENT_AMBULATORY_CARE_PROVIDER_SITE_OTHER): Payer: Medicare HMO | Admitting: Endocrinology

## 2016-05-08 VITALS — BP 126/74 | HR 60 | Ht 68.0 in | Wt 204.0 lb

## 2016-05-08 DIAGNOSIS — N183 Chronic kidney disease, stage 3 unspecified: Secondary | ICD-10-CM

## 2016-05-08 DIAGNOSIS — Z794 Long term (current) use of insulin: Secondary | ICD-10-CM

## 2016-05-08 DIAGNOSIS — E1122 Type 2 diabetes mellitus with diabetic chronic kidney disease: Secondary | ICD-10-CM

## 2016-05-08 LAB — POCT GLYCOSYLATED HEMOGLOBIN (HGB A1C): Hemoglobin A1C: 11.6

## 2016-05-08 MED ORDER — INSULIN GLARGINE 100 UNIT/ML ~~LOC~~ SOLN: 55.0000 [IU] | SUBCUTANEOUS | 11 refills | Status: DC

## 2016-05-08 NOTE — Patient Instructions (Addendum)
check your blood sugar twice a day.  vary the time of day when you check, between before the 3 meals, and at bedtime.  also check if you have symptoms of your blood sugar being too high or too low.  please keep a record of the readings and bring it to your next appointment here (or you can bring the meter itself).  You can write it on any piece of paper.  please call us sooner if your blood sugar goes below 70, or if you have a lot of readings over 200.  The next step is to see why the blood sugar is lower on some days than others Please increase insulin to 55 units each morning.   Please come back for a follow-up appointment in 2 months.

## 2016-05-21 DIAGNOSIS — E119 Type 2 diabetes mellitus without complications: Secondary | ICD-10-CM | POA: Diagnosis not present

## 2016-05-21 DIAGNOSIS — H11441 Conjunctival cysts, right eye: Secondary | ICD-10-CM | POA: Diagnosis not present

## 2016-05-21 DIAGNOSIS — H353131 Nonexudative age-related macular degeneration, bilateral, early dry stage: Secondary | ICD-10-CM | POA: Diagnosis not present

## 2016-05-22 DIAGNOSIS — C61 Malignant neoplasm of prostate: Secondary | ICD-10-CM | POA: Diagnosis not present

## 2016-05-29 DIAGNOSIS — Z8551 Personal history of malignant neoplasm of bladder: Secondary | ICD-10-CM | POA: Diagnosis not present

## 2016-05-29 DIAGNOSIS — N400 Enlarged prostate without lower urinary tract symptoms: Secondary | ICD-10-CM | POA: Diagnosis not present

## 2016-05-29 DIAGNOSIS — R69 Illness, unspecified: Secondary | ICD-10-CM | POA: Diagnosis not present

## 2016-06-15 DIAGNOSIS — R69 Illness, unspecified: Secondary | ICD-10-CM | POA: Diagnosis not present

## 2016-07-05 ENCOUNTER — Ambulatory Visit (INDEPENDENT_AMBULATORY_CARE_PROVIDER_SITE_OTHER): Payer: Medicare HMO | Admitting: Endocrinology

## 2016-07-05 VITALS — BP 128/76 | HR 55 | Ht 68.0 in | Wt 209.0 lb

## 2016-07-05 DIAGNOSIS — Z794 Long term (current) use of insulin: Secondary | ICD-10-CM

## 2016-07-05 DIAGNOSIS — E1122 Type 2 diabetes mellitus with diabetic chronic kidney disease: Secondary | ICD-10-CM | POA: Diagnosis not present

## 2016-07-05 DIAGNOSIS — N183 Chronic kidney disease, stage 3 (moderate): Secondary | ICD-10-CM | POA: Diagnosis not present

## 2016-07-05 LAB — POCT GLYCOSYLATED HEMOGLOBIN (HGB A1C): HEMOGLOBIN A1C: 10.7

## 2016-07-05 MED ORDER — INSULIN DETEMIR 100 UNIT/ML ~~LOC~~ SOLN: 70.0000 [IU] | SUBCUTANEOUS | 11 refills | Status: DC

## 2016-07-05 NOTE — Patient Instructions (Addendum)
check your blood sugar twice a day.  vary the time of day when you check, between before the 3 meals, and at bedtime.  also check if you have symptoms of your blood sugar being too high or too low.  please keep a record of the readings and bring it to your next appointment here (or you can bring the meter itself).  You can write it on any piece of paper.  please call us sooner if your blood sugar goes below 70, or if you have a lot of readings over 200.  The next step is to see why the blood sugar is lower on some days than others Please change the lantus to levemir, 70 units each morning.  This is a starting point, so please feel free to call sooner if the blood sugar is low, or persistently over 200.  You can use up the remaining lantus first.  Please come back for a follow-up appointment in 3 months.

## 2016-07-05 NOTE — Progress Notes (Signed)
Subjective:    Patient ID: Joshua [name], male    DOB: [birth year], 83 y.o.   MRN: [id number]  HPI Pt returns for f/u of diabetes mellitus:  DM type: Insulin-requiring type 2.  Dx'ed: 1996.  Complications: polyneuropathy, CAD, retinopathy, and renal failure.   Therapy: insulin since 2014. DKA: never. Severe hypoglycemia: never.  Pancreatitis: never.   Other: he takes QD insulin, after poor results with multiple daily injections; on AM NPH, he had afternoon hypoglycemia.  dtr prefers syringe and vial, as she draws up for him, and pt administers to himself.  dtr checks fasting cbg when she is there, but pt seldom checks cbg's at other times.  Interval history: dtr says he never misses the insulin.  Meter is downloaded today, and the printout is scanned into the record.  It varies from 87-215.  All are checked fasting.  dtr says fasting glucose depends on what he eats the night before.  Past Medical History:  Diagnosis Date  . Asymptomatic stenosis of right carotid artery    MILD RIGHT ICA  <40%  PER DUPLEX STUDY  2003  . BPH (benign prostatic hypertrophy)   . CKD (chronic kidney disease), stage III   . Frequency of urination   . GERD (gastroesophageal reflux disease)   . H/O hiatal hernia   . History of gastric ulcer   . Hyperlipidemia   . Hypothyroidism   . Nocturia   . Short of breath on exertion   . Type 2 diabetes mellitus ([place])   . Urgency of urination     Past Surgical History:  Procedure Laterality Date  . CARDIAC CATHETERIZATION  01-07-2000  DR Daneen Schick   ESSENTIALLY NORMAL CORONARY ARTERIES W/ MINIMAL PLAQUING RCA & LAD/ NORMAL LVF/ SEVERE TORTUOSITY AORTOILIAC AND FEMORAL TERRITORY / FALSE ABNORMAL CARDIOLITE  . LUMBAR [place]  . TOTAL KNEE ARTHROPLASTY Bilateral LEFT  10-04-2004/   RIGHT  03-17-2008  . TRANSURETHRAL RESECTION OF PROSTATE N/A 02/01/2013   Procedure: TRANSURETHRAL RESECTION OF THE PROSTATEAND TURB  WITH GYRUS INSTRUMENTS;  Surgeon:  Bernestine Amass, MD;  Location: [facility];  Service: Urology;  Laterality: N/A;    Social History   Social History  . Marital status: Married    Spouse name: N/A  . Number of children: N/A  . Years of education: N/A   Occupational History  . Not on file.   Social History Main Topics  . Smoking status: Never Smoker  . Smokeless tobacco: Never Used  . Alcohol use No  . Drug use: No  . Sexual activity: Not on file   Other Topics Concern  . Not on file   Social History Narrative  . No narrative on file    Current Outpatient Prescriptions on File Prior to Visit  Medication Sig Dispense Refill  . B Complex Vitamins (B-COMPLEX/B-12) TABS Take by mouth.    . B-D ULTRAFINE III SHORT PEN 31G X 8 MM MISC See admin instructions. use with insulin pen  5  . donepezil (ARICEPT) 10 MG tablet Take 1 tablet (10 mg total) by mouth at bedtime. 90 tablet 3  . Insulin Syringe-Needle U-100 (INSULIN SYRINGE 1CC/30GX5/16") 30G X 5/16" 1 ML MISC Use to inject insulin 2 times per day. 100 each 2  . levothyroxine (SYNTHROID, LEVOTHROID) 75 MCG tablet Take 75 mcg by mouth daily before breakfast.   3  . ONE TOUCH ULTRA TEST test strip Reported on 11/03/2015  5  . simvastatin (ZOCOR)  40 MG tablet Take 20 mg by mouth every evening.     No current facility-administered medications on file prior to visit.     No Known Allergies  Family History  Problem Relation Age of Onset  . Diabetes Mother   . Diabetes Father   . Heart disease Father   . Heart disease Brother     BP 128/76   Pulse (!) 55   Ht 5\' 8"  (1.727 m)   Wt 209 lb (94.8 kg)   SpO2 95%   BMI 31.78 kg/m    Review of Systems dtr says pt has hypoglycemia.     Objective:   Physical Exam VITAL SIGNS:  See vs page.  GENERAL: no distress.  Pulses: dorsalis pedis intact bilat. MSK: no deformity of the feet.  CV: trace bilat leg edema, and bilat vv's.  Skin: no ulcer on the feet. normal color and temp on the  feet.  Neuro: sensation is intact to touch on the feet, but decreased from normal.  Lab Results  Component Value Date   HGBA1C 10.7 07/05/2016      Assessment & Plan:  insulin-requiring type 2 DM, with retinopathy, worse.  frail elderly state: in this situation, we are limited by he fact that dtr is not there all day.  Also, he is not a candidate for aggressive glycemic control Renal failure: this increases the risk of hypoglycemia.   Patient is advised the following: Patient Instructions  check your blood sugar twice a day.  vary the time of day when you check, between before the 3 meals, and at bedtime.  also check if you have symptoms of your blood sugar being too high or too low.  please keep a record of the readings and bring it to your next appointment here (or you can bring the meter itself).  You can write it on any piece of paper.  please call us sooner if your blood sugar goes below 70, or if you have a lot of readings over 200.  The next step is to see why the blood sugar is lower on some days than others Please change the lantus to levemir, 70 units each morning.  This is a starting point, so please feel free to call sooner if the blood sugar is low, or persistently over 200.  You can use up the remaining lantus first.  Please come back for a follow-up appointment in 3 months.

## 2016-07-12 DIAGNOSIS — H9313 Tinnitus, bilateral: Secondary | ICD-10-CM | POA: Diagnosis not present

## 2016-07-12 DIAGNOSIS — H6980 Other specified disorders of Eustachian tube, unspecified ear: Secondary | ICD-10-CM | POA: Diagnosis not present

## 2016-07-12 DIAGNOSIS — H6123 Impacted cerumen, bilateral: Secondary | ICD-10-CM | POA: Diagnosis not present

## 2016-07-15 DIAGNOSIS — H353221 Exudative age-related macular degeneration, left eye, with active choroidal neovascularization: Secondary | ICD-10-CM | POA: Diagnosis not present

## 2016-07-15 DIAGNOSIS — E119 Type 2 diabetes mellitus without complications: Secondary | ICD-10-CM | POA: Diagnosis not present

## 2016-07-15 DIAGNOSIS — H353133 Nonexudative age-related macular degeneration, bilateral, advanced atrophic without subfoveal involvement: Secondary | ICD-10-CM | POA: Diagnosis not present

## 2016-07-15 DIAGNOSIS — H353212 Exudative age-related macular degeneration, right eye, with inactive choroidal neovascularization: Secondary | ICD-10-CM | POA: Diagnosis not present

## 2016-07-15 DIAGNOSIS — H33011 Retinal detachment with single break, right eye: Secondary | ICD-10-CM | POA: Diagnosis not present

## 2016-08-07 DIAGNOSIS — Z8551 Personal history of malignant neoplasm of bladder: Secondary | ICD-10-CM | POA: Diagnosis not present

## 2016-08-07 DIAGNOSIS — C61 Malignant neoplasm of prostate: Secondary | ICD-10-CM | POA: Diagnosis not present

## 2016-08-13 DIAGNOSIS — G629 Polyneuropathy, unspecified: Secondary | ICD-10-CM | POA: Diagnosis not present

## 2016-08-13 DIAGNOSIS — E7800 Pure hypercholesterolemia, unspecified: Secondary | ICD-10-CM | POA: Diagnosis not present

## 2016-08-13 DIAGNOSIS — D692 Other nonthrombocytopenic purpura: Secondary | ICD-10-CM | POA: Diagnosis not present

## 2016-08-13 DIAGNOSIS — E1149 Type 2 diabetes mellitus with other diabetic neurological complication: Secondary | ICD-10-CM | POA: Diagnosis not present

## 2016-08-13 DIAGNOSIS — R32 Unspecified urinary incontinence: Secondary | ICD-10-CM | POA: Diagnosis not present

## 2016-08-13 DIAGNOSIS — M1990 Unspecified osteoarthritis, unspecified site: Secondary | ICD-10-CM | POA: Diagnosis not present

## 2016-08-13 DIAGNOSIS — R809 Proteinuria, unspecified: Secondary | ICD-10-CM | POA: Diagnosis not present

## 2016-08-13 DIAGNOSIS — N183 Chronic kidney disease, stage 3 (moderate): Secondary | ICD-10-CM | POA: Diagnosis not present

## 2016-08-13 DIAGNOSIS — R5383 Other fatigue: Secondary | ICD-10-CM | POA: Diagnosis not present

## 2016-08-13 DIAGNOSIS — E1129 Type 2 diabetes mellitus with other diabetic kidney complication: Secondary | ICD-10-CM | POA: Diagnosis not present

## 2016-08-16 ENCOUNTER — Telehealth: Payer: Self-pay

## 2016-08-16 ENCOUNTER — Telehealth: Payer: Self-pay | Admitting: Endocrinology

## 2016-08-16 DIAGNOSIS — R69 Illness, unspecified: Secondary | ICD-10-CM | POA: Diagnosis not present

## 2016-08-16 MED ORDER — INSULIN ASPART 100 UNIT/ML ~~LOC~~ SOLN: SUBCUTANEOUS | 1 refills | Status: DC

## 2016-08-16 NOTE — Telephone Encounter (Signed)
Daughter calling BS is running high, blood sugar is over 400 right now

## 2016-08-16 NOTE — Telephone Encounter (Signed)
Have called both contact numbers in this patients chart and have been unable to reach him to get more details about his blood sugar readings- I have advised Dr. Cruzita Lederer and Almyra Free of this matter if his daughter should call back please route it to [place]

## 2016-08-16 NOTE — Telephone Encounter (Signed)
Patient daughter called back. She was calling concerning her father blood sugar readings. She stated that Dr.Ellison changed his insulin from Lantus to Levemir in March, ever since this change his sugars have been getting higher and higher. Today she checked them and they were as high as they have been. They were 597, 300, 403. I advised that Dr.Ellison, and his nurse were both gone for the day, but a covering doctor would look over this. The daughter was very upset that she was not called back to begin with after she stated she gave her number to be called back on. I advised with her that there was no emergency number on his chart with her number attached and we would get that fixed. I put patient on hold, spoke with Dr.Gherghe and she stated to switch back to the Lantus to take 50 units. Also advised me to send in Novolog vials for the patient to take 10 units before every meal, and to give him 10 units once the daughter received it. I had to call in the Novolog to the pharmacy and daughter was going to pick it up to give to him. Dr.Gherghe also advised me to tell daughter that if this did not bring sugars down to go to the emergency room to receive treatment. Daughter understood and was very appreciative for the help and understanding.

## 2016-08-19 ENCOUNTER — Telehealth: Payer: Self-pay | Admitting: Endocrinology

## 2016-08-19 DIAGNOSIS — R69 Illness, unspecified: Secondary | ICD-10-CM | POA: Diagnosis not present

## 2016-08-19 MED ORDER — ONETOUCH ULTRA MINI W/DEVICE KIT: PACK | 2 refills | Status: DC

## 2016-08-19 NOTE — Telephone Encounter (Signed)
Rx for the meter submitted.

## 2016-08-19 NOTE — Telephone Encounter (Signed)
Pt called in and said that his meter broke and he needs a new One Touch Meter sent into [facility] on [address].

## 2016-09-27 DIAGNOSIS — R809 Proteinuria, unspecified: Secondary | ICD-10-CM | POA: Diagnosis not present

## 2016-09-27 DIAGNOSIS — E039 Hypothyroidism, unspecified: Secondary | ICD-10-CM | POA: Diagnosis not present

## 2016-09-27 DIAGNOSIS — R69 Illness, unspecified: Secondary | ICD-10-CM | POA: Diagnosis not present

## 2016-09-27 DIAGNOSIS — N183 Chronic kidney disease, stage 3 (moderate): Secondary | ICD-10-CM | POA: Diagnosis not present

## 2016-09-27 DIAGNOSIS — M1990 Unspecified osteoarthritis, unspecified site: Secondary | ICD-10-CM | POA: Diagnosis not present

## 2016-09-27 DIAGNOSIS — E7800 Pure hypercholesterolemia, unspecified: Secondary | ICD-10-CM | POA: Diagnosis not present

## 2016-09-27 DIAGNOSIS — E1149 Type 2 diabetes mellitus with other diabetic neurological complication: Secondary | ICD-10-CM | POA: Diagnosis not present

## 2016-09-27 DIAGNOSIS — E1129 Type 2 diabetes mellitus with other diabetic kidney complication: Secondary | ICD-10-CM | POA: Diagnosis not present

## 2016-09-27 DIAGNOSIS — G629 Polyneuropathy, unspecified: Secondary | ICD-10-CM | POA: Diagnosis not present

## 2016-09-27 DIAGNOSIS — Z794 Long term (current) use of insulin: Secondary | ICD-10-CM | POA: Diagnosis not present

## 2016-09-30 ENCOUNTER — Telehealth: Payer: Self-pay | Admitting: Endocrinology

## 2016-09-30 MED ORDER — INSULIN DETEMIR 100 UNIT/ML ~~LOC~~ SOLN: 70.0000 [IU] | SUBCUTANEOUS | 11 refills | Status: DC

## 2016-09-30 NOTE — Telephone Encounter (Signed)
[employer] notified Lantus has been discontinued.

## 2016-09-30 NOTE — Telephone Encounter (Signed)
Holland Falling has questions about the patient's rx for insulin detemir (LEVEMIR) 100 UNIT/ML injection and Lantus. [employer] needs to know why the patient is taking both. I only saw the insulin detemir (LEVEMIR) 100 UNIT/ML injection listed in the patient's chart.    Call number provided to advise.

## 2016-10-11 DIAGNOSIS — E119 Type 2 diabetes mellitus without complications: Secondary | ICD-10-CM | POA: Diagnosis not present

## 2016-10-11 DIAGNOSIS — H353131 Nonexudative age-related macular degeneration, bilateral, early dry stage: Secondary | ICD-10-CM | POA: Diagnosis not present

## 2016-10-14 ENCOUNTER — Ambulatory Visit: Payer: Medicare HMO | Admitting: Endocrinology

## 2016-10-14 DIAGNOSIS — Z0289 Encounter for other administrative examinations: Secondary | ICD-10-CM

## 2016-10-26 ENCOUNTER — Other Ambulatory Visit: Payer: Self-pay | Admitting: Neurology

## 2016-10-28 DIAGNOSIS — H353222 Exudative age-related macular degeneration, left eye, with inactive choroidal neovascularization: Secondary | ICD-10-CM | POA: Diagnosis not present

## 2016-10-28 DIAGNOSIS — E119 Type 2 diabetes mellitus without complications: Secondary | ICD-10-CM | POA: Diagnosis not present

## 2016-10-28 DIAGNOSIS — H353212 Exudative age-related macular degeneration, right eye, with inactive choroidal neovascularization: Secondary | ICD-10-CM | POA: Diagnosis not present

## 2016-10-28 DIAGNOSIS — H33011 Retinal detachment with single break, right eye: Secondary | ICD-10-CM | POA: Diagnosis not present

## 2016-10-30 ENCOUNTER — Encounter: Payer: Self-pay | Admitting: Endocrinology

## 2016-10-30 ENCOUNTER — Ambulatory Visit (INDEPENDENT_AMBULATORY_CARE_PROVIDER_SITE_OTHER): Payer: Medicare HMO | Admitting: Endocrinology

## 2016-10-30 VITALS — BP 128/76 | HR 69 | Wt 207.6 lb

## 2016-10-30 DIAGNOSIS — N183 Chronic kidney disease, stage 3 unspecified: Secondary | ICD-10-CM

## 2016-10-30 DIAGNOSIS — E1122 Type 2 diabetes mellitus with diabetic chronic kidney disease: Secondary | ICD-10-CM

## 2016-10-30 DIAGNOSIS — Z794 Long term (current) use of insulin: Secondary | ICD-10-CM

## 2016-10-30 DIAGNOSIS — R69 Illness, unspecified: Secondary | ICD-10-CM | POA: Diagnosis not present

## 2016-10-30 LAB — POCT GLYCOSYLATED HEMOGLOBIN (HGB A1C): HEMOGLOBIN A1C: 11.5

## 2016-10-30 MED ORDER — INSULIN DETEMIR 100 UNIT/ML ~~LOC~~ SOLN: 90.0000 [IU] | SUBCUTANEOUS | 11 refills | Status: DC

## 2016-10-30 MED ORDER — ONETOUCH ULTRA BLUE VI STRP: 1.0000 | ORAL_STRIP | Freq: Two times a day (BID) | 5 refills | Status: DC

## 2016-10-30 NOTE — Progress Notes (Signed)
Subjective:    Patient ID: Joshua [name], male    DOB: [birth year], 83 y.o.   MRN: [id number]  HPI Pt returns for f/u of diabetes mellitus:  DM type: Insulin-requiring type 2.  Dx'ed: 1996.  Complications: polyneuropathy, CAD, retinopathy, and renal failure.   Therapy: insulin since 2014. DKA: never. Severe hypoglycemia: never.  Pancreatitis: never.   Other: he takes QD insulin; no one is at home to give him multiple daily injections or to check cbg at any time other than fasting; on AM NPH, he had afternoon hypoglycemia.  dtr prefers syringe and vial, as she draws up for him, and pt administers to himself.  dtr checks fasting cbg when she is there, but pt seldom checks cbg's at other times.  Interval history: dtr says he never misses the insulin.  Meter is downloaded today, and the printout is scanned into the record.  It varies from 140-450.  All are checked fasting. He takes lantus, 50 units qd, and no other insulin.  When he took levemir, cbg's were 400-500's.  dtr provides hx, due pt's poor health Past Medical History:  Diagnosis Date  . Asymptomatic stenosis of right carotid artery    MILD RIGHT ICA  <40%  PER DUPLEX STUDY  2003  . BPH (benign prostatic hypertrophy)   . CKD (chronic kidney disease), stage III   . Frequency of urination   . GERD (gastroesophageal reflux disease)   . H/O hiatal hernia   . History of gastric ulcer   . Hyperlipidemia   . Hypothyroidism   . Nocturia   . Short of breath on exertion   . Type 2 diabetes mellitus ([place])   . Urgency of urination     Past Surgical History:  Procedure Laterality Date  . CARDIAC CATHETERIZATION  01-07-2000  DR Daneen Schick   ESSENTIALLY NORMAL CORONARY ARTERIES W/ MINIMAL PLAQUING RCA & LAD/ NORMAL LVF/ SEVERE TORTUOSITY AORTOILIAC AND FEMORAL TERRITORY / FALSE ABNORMAL CARDIOLITE  . LUMBAR [place]  . TOTAL KNEE ARTHROPLASTY Bilateral LEFT  10-04-2004/   RIGHT  03-17-2008  . TRANSURETHRAL RESECTION OF  PROSTATE N/A 02/01/2013   Procedure: TRANSURETHRAL RESECTION OF THE PROSTATEAND TURB  WITH GYRUS INSTRUMENTS;  Surgeon: Bernestine Amass, MD;  Location: [facility];  Service: Urology;  Laterality: N/A;    Social History   Social History  . Marital status: Married    Spouse name: N/A  . Number of children: N/A  . Years of education: N/A   Occupational History  . Not on file.   Social History Main Topics  . Smoking status: Never Smoker  . Smokeless tobacco: Never Used  . Alcohol use No  . Drug use: No  . Sexual activity: Not on file   Other Topics Concern  . Not on file   Social History Narrative  . No narrative on file    Current Outpatient Prescriptions on File Prior to Visit  Medication Sig Dispense Refill  . B Complex Vitamins (B-COMPLEX/B-12) TABS Take by mouth.    . donepezil (ARICEPT) 10 MG tablet TAKE 1 TABLET BY MOUTH AT BEDTIME 90 tablet 0  . Insulin Syringe-Needle U-100 (INSULIN SYRINGE 1CC/30GX5/16") 30G X 5/16" 1 ML MISC Use to inject insulin 2 times per day. 100 each 2  . levothyroxine (SYNTHROID, LEVOTHROID) 75 MCG tablet Take 75 mcg by mouth daily before breakfast.   3  . simvastatin (ZOCOR) 40 MG tablet Take 20 mg by mouth every evening.  No current facility-administered medications on file prior to visit.     No Known Allergies  Family History  Problem Relation Age of Onset  . Diabetes Mother   . Diabetes Father   . Heart disease Father   . Heart disease Brother     BP 128/76   Pulse 69   Wt 207 lb 9.6 oz (94.2 kg)   SpO2 94%   BMI 31.57 kg/m    Review of Systems Pt and dtr deny pt has hypoglycemia    Objective:   Physical Exam VITAL SIGNS:  See vs page.  GENERAL: no distress.  Pulses: foot pulses are intact bilaterally.   MSK: no deformity of the feet or ankles.  CV: trace bilat leg edema of the legs, and bilat vv's.  Skin:  no ulcer on the feet or ankles.  normal color and temp on the feet and ankles.  Neuro:  sensation is intact to touch on the feet and ankles.   Ext: There is bilateral onychomycosis of the toenails.    A1c=11.5%    Assessment & Plan:  Insulin-requiring type 2 DM, with renal failure: the pattern of his cbg's indicates he needs a faster-acting qd insulin than lantus.   Home situation: I discussed with pt and dtr.  They'll try to check some cbg's later in the day.    Patient Instructions  check your blood sugar twice a day.  vary the time of day when you check, between before the 3 meals, and at bedtime.  also check if you have symptoms of your blood sugar being too high or too low.  please keep a record of the readings and bring it to your next appointment here (or you can bring the meter itself).  You can write it on any piece of paper.  please call us sooner if your blood sugar goes below 70, or if you have a lot of readings over 200.  Please change the lantus back to levemir, 90 units each morning.   On this type of insulin schedule, you should eat meals on a regular schedule.  If a meal is missed or significantly delayed, your blood sugar could go low.  Please call or message [place] next week, to tell us how the blood sugar is doing.   Please come back for a follow-up appointment in 2 months.

## 2016-10-30 NOTE — Patient Instructions (Addendum)
check your blood sugar twice a day.  vary the time of day when you check, between before the 3 meals, and at bedtime.  also check if you have symptoms of your blood sugar being too high or too low.  please keep a record of the readings and bring it to your next appointment here (or you can bring the meter itself).  You can write it on any piece of paper.  please call us sooner if your blood sugar goes below 70, or if you have a lot of readings over 200.  Please change the lantus back to levemir, 90 units each morning.   On this type of insulin schedule, you should eat meals on a regular schedule.  If a meal is missed or significantly delayed, your blood sugar could go low.  Please call or message [place] next week, to tell us how the blood sugar is doing.   Please come back for a follow-up appointment in 2 months.

## 2016-11-03 NOTE — Progress Notes (Signed)
Joshua Joshua [name] Joshua [name] was seen today in the movement disorders clinic for neurologic consultation at the request of Joshua Joshua [name] Arabian, MD.  The consultation is for the evaluation of gait and memory changes.  Pt is accompanied by Joshua Joshua [name] Joshua [name] who is Joshua Joshua [name] Joshua [name] and Joshua [name], who supplement the history.  Joshua Joshua [name] Joshua [name] brought in several notes for me to review prior to Joshua Joshua [name] Joshua [name].  Her biggest concern is the patient's memory loss and that started about 1 year ago and that got worse over the last year.  The memory seems to fluctuate.  The patients Joshua [name] has Parkinson's disease and they are the only 2 in the [name].  However, the patient is not able to assist much with the caregiving of Joshua Joshua [name] Joshua [name], who is still very functional.  He will get dressed and then forget that he has gotten dressed and try to put on another pair of pants on top of the first pair that he just put on.  Recently, he got Joshua Joshua [name] Joshua [name] mixed up with soap and sprayed shaving [name] all over the bathroom.  He is still driving, and he will forget where to go.  He admits that he "messes up even in familiar places."   The pt has lived in the same [name] with Joshua Joshua [name] Joshua [name] for 71 years.  They live in a one story [name].  Joshua Joshua [name] Joshua [name] has always taken care of finances.  He takes care of distributing Joshua Joshua [name] own medications but last week they got him Joshua Joshua [name] own pill box that Joshua Joshua [name] Joshua [name] is preparing.  She wasn't sure that the medications were getting taken properly.  There are no word finding troubles.  No appetite troubles.  Pt admits that he sleeps most of the day.  Until December, he was working and since then, he really hasn't gotten into any type of routine.  There are no hallucinations.    01/31/14 update:  Pt is f/u today.  No one accompanies him back to the room.  I started the patient on aricept last [name].  The patient reports that he is doing well on this medication.  The patient did have Joshua Joshua [name] thyroid rechecked by Joshua Joshua [name] Joshua [name] since last [name] as Joshua Joshua [name] Joshua [name] was  elevated at 5.16 last [name], but after treatment it had improved and [name] in June, 2015 was now 0.91.  However, Joshua Joshua [name] Joshua [name] in June was 9.2 and this was repeated on 12/29/2013 and was 9.3.  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  I set him up with physical therapy.  Joshua Joshua [name] Joshua [name] has been a little low and he admits that he only takes the oral supplement about 2-3 times per week.    08/01/14 update:  Patient is following up today for Joshua Joshua [name] Joshua [name].  He has a history of [name] deficiency.  We recheck this last [name] and it was normal at 485 but he has since quit taking Joshua Joshua [name] Joshua [name].  He is on Aricept and is doing well with this medication.  He and Joshua Joshua [name] Joshua [name] feel that he has been stable in terms of memory. He physically helps take care of Joshua Joshua [name] Joshua [name], who has PD.  Joshua Joshua [name] Joshua [name] has not been well controlled  11/03/15 update:  The patient returns today, accompanied by Joshua Joshua [name] Joshua [name] who supplements the history.  I have reviewed Joshua Joshua [name] Joshua [name] made available to me.  I have not seen him in about 15 months.  He has  a history of vascular Joshua [name].  He is on Aricept.  He reports that he is managing Joshua Joshua [name] Joshua [name] except Joshua [name].  Joshua Joshua [name] Joshua [name] is helping draw Joshua Joshua [name] Joshua [name] up.  Last [name], I asked him to restart Joshua Joshua [name] Joshua [name] and he states that he is doing that.  I also told him that he needed to stop driving last [name] unless he passed an occupational driving evaluation, which was not completed.  He reports that he is driving still.  Joshua Joshua [name] Joshua [name] doesn't drive.  Reports that Joshua Joshua [name] Joshua [name] lives 800 feet behind Joshua Joshua [name] Joshua [name].  Joshua [name] does the cooking some, Joshua [name] does some.  Joshua [name] does finances.  Joshua [name] reports that she is fairly healthy.    11/05/16 update:  Pt returns for f/u accompanied by Joshua Joshua [name] Joshua [name] who supplements the history.  The Joshua [name] that were made available to me were reviewed (Joshua Joshua [name] Joshua [name] Joshua [name]).  Pt remains on Aricept.  Joshua [name] puts in pill box but patient  states that he is able to remember to take them and Joshua [name] agrees. Joshua [name] takes care of helping him with ADL's.  Joshua [name] does finances.    Joshua [name] does cooking.  He is driving.  He does sleep/doze some during the day.  He is sleeping well at night.   Neuroimaging has  previously been performed.  It is available for my review today.  A CT of the brain was performed in 2012 demonstrating atrophy  PREVIOUS MEDICATIONS: none to date  ALLERGIES:  No Known Allergies  CURRENT MEDICATIONS:  Current Outpatient Prescriptions on File Prior to [name]  Medication Sig Dispense Refill  . donepezil (ARICEPT) 10 MG tablet TAKE 1 TABLET BY MOUTH AT BEDTIME 90 tablet 0  . levothyroxine (SYNTHROID, LEVOTHROID) 75 MCG tablet Take 75 mcg by mouth daily before breakfast.   3  . simvastatin (ZOCOR) 40 MG tablet Take 20 mg by mouth every evening.     No current facility-administered medications on file prior to [name].     PAST MEDICAL HISTORY:   Past Medical History:  Diagnosis Date  . Asymptomatic stenosis of right carotid artery    MILD RIGHT ICA  <40%  PER DUPLEX STUDY  2003  . BPH (benign prostatic hypertrophy)   . CKD (chronic kidney disease), stage III   . Frequency of urination   . GERD (gastroesophageal reflux disease)   . H/O hiatal hernia   . History of gastric ulcer   . Hyperlipidemia   . Hypothyroidism   . Nocturia   . Short of breath on exertion   . Type 2 diabetes mellitus ([place])   . Urgency of urination     PAST SURGICAL HISTORY:   Past Surgical History:  Procedure Laterality Date  . CARDIAC CATHETERIZATION  01-07-2000  DR Daneen Schick   ESSENTIALLY NORMAL CORONARY ARTERIES W/ MINIMAL PLAQUING RCA & LAD/ NORMAL LVF/ SEVERE TORTUOSITY AORTOILIAC AND FEMORAL TERRITORY / FALSE ABNORMAL CARDIOLITE  . LUMBAR [place]  . TOTAL KNEE ARTHROPLASTY Bilateral LEFT  10-04-2004/   RIGHT  03-17-2008  . TRANSURETHRAL RESECTION OF PROSTATE N/A 02/01/2013   Procedure: TRANSURETHRAL RESECTION  OF THE PROSTATEAND TURB  WITH GYRUS INSTRUMENTS;  Surgeon: Bernestine Amass, MD;  Location: [facility];  Service: Urology;  Laterality: N/A;    SOCIAL HISTORY:   Social History   Social History  . Marital status: Married    Spouse name: N/A  . Number of children: N/A  . Years of education: N/A   Occupational History  . Not  on file.   Social History Main Topics  . Smoking status: Never Smoker  . Smokeless tobacco: Never Used  . Alcohol use No  . Drug use: No  . Sexual activity: Not on file   Other Topics Concern  . Not on file   Social History Narrative  . No narrative on file    FAMILY HISTORY:   Family Status  Relation Status  . Mother Deceased       diabetes  . Father Deceased       heart disease, diabetes  . Sister Alive  . Brother Alive  . Joshua [name] Alive  . Son Alive  . MGM Deceased  . MGF Deceased  . PGM Deceased  . PGF Deceased  . Brother Alive  . Brother Alive  . Brother Deceased       heart disease    ROS:  A complete 10 system review of systems was obtained and was unremarkable apart from what is mentioned above.  PHYSICAL EXAMINATION:    VITALS:   Vitals:   11/05/16 0844  BP: 110/80  Pulse: (!) 58  SpO2: 92%  Weight: 206 lb (93.4 kg)  Height: 5\' 8"  (1.727 m)    GEN:  The patient appears stated age and is in NAD. HEENT:  Normocephalic, atraumatic.  The mucous membranes are moist. The superficial temporal arteries are without ropiness or tenderness. CV:  RRR Lungs:  CTAB Neck/HEME:  There are no carotid bruits bilaterally.  Neurological examination:  Orientation:  Montreal Cognitive Assessment  11/03/2015 08/01/2014  Visuospatial/ Executive (0/5) 1 2  Naming (0/3) 2 2  Attention: Read list of digits (0/2) 1 1  Attention: Read list of letters (0/1) 1 1  Attention: Serial 7 subtraction starting at 100 (0/3) 0 1  Language: Repeat phrase (0/2) 2 2  Language : Fluency (0/1) 0 0  Abstraction (0/2) 0 0  Delayed Recall  (0/5) 0 0  Orientation (0/6) 5 5  Total 12 14  Adjusted Score (based on education) 13 15    Sensation: Sensation is intact to light and pinprick throughout (facial, trunk, extremities). Vibration is decreased at the bilateral big toe. There is no extinction with double simultaneous stimulation. There is no sensory dermatomal level identified. Motor: Strength is 5/5 in the bilateral upper and lower extremities.   Shoulder shrug is equal and symmetric.  There is no pronator drift. Frontal release signs: There is a bilateral palmomental response.  There is a positive glabellar tap (Myerson sign).  No rooting reflex.  Movement examination: Tone: There is no increased tone in the upper extremities/LE Abnormal movements: There was a mild tremor of the outstretched hands Coordination:  There is no significant decremation with RAM's Gait and Station: The patient has minimal difficulty arising out of a deep-seated chair without the use of the hands.  He has a slight stooped posture. The patient's stride length is decreased, with a wide-based gait.    ASSESSMENT/PLAN:  1.  Memory loss  -I do think that this is multifactorial, but think the primary etiology is vascular Joshua [name].  He is doing well on aricept and he can continue that.   -We talked again about safety. We talked about the importance of medication monitoring.  We talked about driving.  I do not want him to drive, unless he passes an occupational therapy driving evaluation.  Right now, he was not willing to do that. Joshua Joshua [name] MoCA, as expected, continues to decline and he performs poorly on trail making, which  is a predictor of driving.  In my medical opinion, he should not drive.  Discussed this in detail today as he is still driving  -talked about mental, physical exercises.  Talked about socialization, which he is doing with church. 2.  gait changes.  -I do think he has evidence of vascular parkinsonism.  I do not think he has idiopathic  Parkinson's disease.  We talked about the differences.  He has had no falls 3.  [name] deficiency  -Is on oral [name] supplements 4.  F/u 1 year. Much greater than 50% of this [name] was spent in counseling and coordinating care.  Total face to face time:  30 min

## 2016-11-05 ENCOUNTER — Ambulatory Visit (INDEPENDENT_AMBULATORY_CARE_PROVIDER_SITE_OTHER): Payer: Medicare HMO | Admitting: Neurology

## 2016-11-05 ENCOUNTER — Telehealth: Payer: Self-pay | Admitting: Endocrinology

## 2016-11-05 ENCOUNTER — Encounter: Payer: Self-pay | Admitting: Neurology

## 2016-11-05 VITALS — BP 110/80 | HR 58 | Ht 68.0 in | Wt 206.0 lb

## 2016-11-05 DIAGNOSIS — F0150 Vascular dementia without behavioral disturbance: Secondary | ICD-10-CM | POA: Diagnosis not present

## 2016-11-05 DIAGNOSIS — G214 Vascular parkinsonism: Secondary | ICD-10-CM

## 2016-11-05 DIAGNOSIS — E538 Deficiency of other specified B group vitamins: Secondary | ICD-10-CM | POA: Diagnosis not present

## 2016-11-05 DIAGNOSIS — R69 Illness, unspecified: Secondary | ICD-10-CM | POA: Diagnosis not present

## 2016-11-05 MED ORDER — DONEPEZIL HCL 10 MG PO TABS: 10.0000 mg | ORAL_TABLET | Freq: Every day | ORAL | 3 refills | Status: DC

## 2016-11-05 NOTE — Telephone Encounter (Signed)
Patient daughter Santiago Glad has questions about patient insulin. Please advise

## 2016-11-06 ENCOUNTER — Other Ambulatory Visit: Payer: Self-pay

## 2016-11-06 MED ORDER — LANTUS 100 UNIT/ML ~~LOC~~ SOLN: 50.0000 [IU] | SUBCUTANEOUS | 11 refills | Status: DC

## 2016-11-06 NOTE — Telephone Encounter (Signed)
Called patient's daughter and she said that he was taking the 50 units of Lantus a day. It was reduced to 50 by on call doctor when you were on vacation because chart says 55 units. She states that his blood sugars are running close to 200 most BG checks. Advise?

## 2016-11-06 NOTE — Telephone Encounter (Signed)
I contacted the patient's daughter and she stated the Levemir was going to cost 300$ She stated this was not going to work for him. She stated the patient had some left over Lantus and that is what he would continue to be on at this time. Please advise, Thanks!

## 2016-11-06 NOTE — Telephone Encounter (Signed)
Ok, to confirm: he is taking lantus 70/d?

## 2016-11-06 NOTE — Telephone Encounter (Signed)
Ok, please continue 50/d for now.

## 2016-12-18 DIAGNOSIS — R69 Illness, unspecified: Secondary | ICD-10-CM | POA: Diagnosis not present

## 2016-12-31 ENCOUNTER — Ambulatory Visit (INDEPENDENT_AMBULATORY_CARE_PROVIDER_SITE_OTHER): Payer: Medicare HMO | Admitting: Endocrinology

## 2016-12-31 ENCOUNTER — Encounter: Payer: Self-pay | Admitting: Endocrinology

## 2016-12-31 VITALS — BP 122/84 | HR 62 | Wt 203.6 lb

## 2016-12-31 DIAGNOSIS — H353222 Exudative age-related macular degeneration, left eye, with inactive choroidal neovascularization: Secondary | ICD-10-CM | POA: Diagnosis not present

## 2016-12-31 DIAGNOSIS — H353212 Exudative age-related macular degeneration, right eye, with inactive choroidal neovascularization: Secondary | ICD-10-CM | POA: Diagnosis not present

## 2016-12-31 DIAGNOSIS — Z794 Long term (current) use of insulin: Secondary | ICD-10-CM

## 2016-12-31 DIAGNOSIS — N183 Chronic kidney disease, stage 3 unspecified: Secondary | ICD-10-CM

## 2016-12-31 DIAGNOSIS — E119 Type 2 diabetes mellitus without complications: Secondary | ICD-10-CM | POA: Diagnosis not present

## 2016-12-31 DIAGNOSIS — E1122 Type 2 diabetes mellitus with diabetic chronic kidney disease: Secondary | ICD-10-CM | POA: Diagnosis not present

## 2016-12-31 DIAGNOSIS — H353133 Nonexudative age-related macular degeneration, bilateral, advanced atrophic without subfoveal involvement: Secondary | ICD-10-CM | POA: Diagnosis not present

## 2016-12-31 LAB — POCT GLYCOSYLATED HEMOGLOBIN (HGB A1C): Hemoglobin A1C: 11.5

## 2016-12-31 MED ORDER — LANTUS 100 UNIT/ML ~~LOC~~ SOLN: 55.0000 [IU] | SUBCUTANEOUS | 11 refills | Status: DC

## 2016-12-31 NOTE — Patient Instructions (Addendum)
check your blood sugar twice a day.  vary the time of day when you check, between before the 3 meals, and at bedtime.  also check if you have symptoms of your blood sugar being too high or too low.  please keep a record of the readings and bring it to your next appointment here (or you can bring the meter itself).  You can write it on any piece of paper.  please call us sooner if your blood sugar goes below 70, or if you have a lot of readings over 200.  Please increase the lantus to 55 units each morning.   On this type of insulin schedule, you should eat meals on a regular schedule.  If a meal is missed or significantly delayed, your blood sugar could go low.   Please come back for a follow-up appointment in 2 months.

## 2016-12-31 NOTE — Progress Notes (Signed)
Subjective:    Patient ID: Joshua [name], male    DOB: March 18, 1929, 83 y.o.   MRN: [id number]  HPI Pt returns for f/u of diabetes mellitus:  DM type: Insulin-requiring type 2.  Dx'ed: 1996.  Complications: polyneuropathy, CAD, retinopathy, and renal failure.  Therapy: insulin since 2014. DKA: never. Severe hypoglycemia: never.  Pancreatitis: never.   Other: he takes QD insulin; no one is at home to give him multiple daily injections or to check cbg at any time other than fasting; on AM NPH, he had afternoon hypoglycemia; levemir was too expensive.  dtr prefers syringe and vial, as she draws up for him, and pt administers to himself.  dtr checks fasting cbg when she is there, but pt seldom checks cbg's at other times.  Interval history: dtr says he never misses the insulin.  Meter is downloaded today, and the printout is scanned into the record.  It varies from 117-300.  All are checked fasting. He takes lantus, 50 units qd, and no other insulin.  dtr provides hx, due pt's poor health.   Past Medical History:  Diagnosis Date  . Asymptomatic stenosis of right carotid artery    MILD RIGHT ICA  <40%  PER DUPLEX STUDY  2003  . BPH (benign prostatic hypertrophy)   . CKD (chronic kidney disease), stage III   . Frequency of urination   . GERD (gastroesophageal reflux disease)   . H/O hiatal hernia   . History of gastric ulcer   . Hyperlipidemia   . Hypothyroidism   . Nocturia   . Short of breath on exertion   . Type 2 diabetes mellitus ([place])   . Urgency of urination     Past Surgical History:  Procedure Laterality Date  . CARDIAC CATHETERIZATION  01-07-2000  DR Daneen Schick   ESSENTIALLY NORMAL CORONARY ARTERIES W/ MINIMAL PLAQUING RCA & LAD/ NORMAL LVF/ SEVERE TORTUOSITY AORTOILIAC AND FEMORAL TERRITORY / FALSE ABNORMAL CARDIOLITE  . LUMBAR [place]  . TOTAL KNEE ARTHROPLASTY Bilateral LEFT  10-04-2004/   RIGHT  03-17-2008  . TRANSURETHRAL RESECTION OF PROSTATE N/A  02/01/2013   Procedure: TRANSURETHRAL RESECTION OF THE PROSTATEAND TURB  WITH GYRUS INSTRUMENTS;  Surgeon: Bernestine Amass, MD;  Location: [facility];  Service: Urology;  Laterality: N/A;    Social History   Social History  . Marital status: Married    Spouse name: N/A  . Number of children: N/A  . Years of education: N/A   Occupational History  . Not on file.   Social History Main Topics  . Smoking status: Never Smoker  . Smokeless tobacco: Never Used  . Alcohol use No  . Drug use: No  . Sexual activity: Not on file   Other Topics Concern  . Not on file   Social History Narrative  . No narrative on file    Current Outpatient Prescriptions on File Prior to Visit  Medication Sig Dispense Refill  . donepezil (ARICEPT) 10 MG tablet Take 1 tablet (10 mg total) by mouth at bedtime. 90 tablet 3  . levothyroxine (SYNTHROID, LEVOTHROID) 75 MCG tablet Take 75 mcg by mouth daily before breakfast.   3  . simvastatin (ZOCOR) 40 MG tablet Take 20 mg by mouth every evening.    . vitamin B-12 (CYANOCOBALAMIN) 1000 MCG tablet Take 1,000 mcg by mouth daily.     No current facility-administered medications on file prior to visit.     No Known Allergies  Family  History  Problem Relation Age of Onset  . Diabetes Mother   . Diabetes Father   . Heart disease Father   . Heart disease Brother     BP 122/84   Pulse 62   Wt 203 lb 9.6 oz (92.4 kg)   SpO2 94%   BMI 30.96 kg/m    Review of Systems He denies hypoglycemia.      Objective:   Physical Exam VITAL SIGNS:  See vs page.  GENERAL: no distress.  Pulses: foot pulses are intact bilaterally.   MSK: no deformity of the feet or ankles.  CV: trace bilat leg edema of the legs, and bilat vv's.  Skin:  no ulcer on the feet or ankles.  normal color and temp on the feet and ankles Neuro: sensation is intact to touch on the feet and ankles.   Ext: There is bilateral onychomycosis of the toenails     Assessment &  Plan:  Insulin-requiring type 2 DM, with DR: he needs increased rx Renal failure: he is at risk for hypoglycemia at night  Patient Instructions  check your blood sugar twice a day.  vary the time of day when you check, between before the 3 meals, and at bedtime.  also check if you have symptoms of your blood sugar being too high or too low.  please keep a record of the readings and bring it to your next appointment here (or you can bring the meter itself).  You can write it on any piece of paper.  please call us sooner if your blood sugar goes below 70, or if you have a lot of readings over 200.  Please increase the lantus to 55 units each morning.   On this type of insulin schedule, you should eat meals on a regular schedule.  If a meal is missed or significantly delayed, your blood sugar could go low.   Please come back for a follow-up appointment in 2 months.

## 2017-01-28 DIAGNOSIS — M25512 Pain in left shoulder: Secondary | ICD-10-CM | POA: Diagnosis not present

## 2017-01-28 DIAGNOSIS — Z23 Encounter for immunization: Secondary | ICD-10-CM | POA: Diagnosis not present

## 2017-02-07 DIAGNOSIS — R69 Illness, unspecified: Secondary | ICD-10-CM | POA: Diagnosis not present

## 2017-02-18 ENCOUNTER — Ambulatory Visit (INDEPENDENT_AMBULATORY_CARE_PROVIDER_SITE_OTHER): Payer: Medicare HMO

## 2017-02-18 ENCOUNTER — Encounter (INDEPENDENT_AMBULATORY_CARE_PROVIDER_SITE_OTHER): Payer: Self-pay | Admitting: Orthopaedic Surgery

## 2017-02-18 ENCOUNTER — Ambulatory Visit (INDEPENDENT_AMBULATORY_CARE_PROVIDER_SITE_OTHER): Payer: Medicare HMO | Admitting: Orthopaedic Surgery

## 2017-02-18 VITALS — BP 141/85 | HR 70 | Resp 14 | Ht 70.0 in | Wt 200.0 lb

## 2017-02-18 DIAGNOSIS — G8929 Other chronic pain: Secondary | ICD-10-CM | POA: Diagnosis not present

## 2017-02-18 DIAGNOSIS — M25512 Pain in left shoulder: Secondary | ICD-10-CM

## 2017-02-18 MED ORDER — LIDOCAINE HCL 1 % IJ SOLN
2.0000 mL | INTRAMUSCULAR | Status: AC | PRN
  Administered 2017-02-18: 2 mL

## 2017-02-18 MED ORDER — METHYLPREDNISOLONE ACETATE 40 MG/ML IJ SUSP
40.0000 mg | INTRAMUSCULAR | Status: AC | PRN
  Administered 2017-02-18: 40 mg via INTRA_ARTICULAR

## 2017-02-18 MED ORDER — BUPIVACAINE HCL 0.5 % IJ SOLN
2.0000 mL | INTRAMUSCULAR | Status: AC | PRN
  Administered 2017-02-18: 2 mL via INTRA_ARTICULAR

## 2017-02-18 NOTE — Progress Notes (Signed)
Office Visit Note   Patient: Joshua [name]           Date of Birth: 1928-05-26           MRN: [id number] Visit Date: 02/18/2017              Requested by: Gaynelle Arabian, MD [address] [place], [place] [address] PCP: Gaynelle Arabian, MD   Assessment & Plan: Visit Diagnoses:  1. Chronic left shoulder pain     Plan:  #1: Corticosteroid injection intra-articular to the left shoulder was performed.  He had marked relief.  He had good range of motion after the injection. #2: Physical therapy at [facility] for shoulder strengthening to the left shoulder #3: We did discuss that the other option would be to obtain an MRI scan looking at the rotator cuff and if indicated then may be a rotator cuff repair but at this time he has not interested in any type of surgical evaluation.  Follow-Up Instructions: Return in about 4 weeks (around 03/18/2017).  If not improved.  Orders:  Orders Placed This Encounter  Procedures  . Large Joint Inj  . XR Shoulder Left  . Ambulatory referral to Physical Therapy   No orders of the defined types were placed in this encounter.     Procedures: Large Joint Inj: L glenohumeral on 02/18/2017 3:43 PM Details: 25 G 1.5 in needle, medial approach  Arthrogram: No  Medications: 2 mL lidocaine 1 %; 2 mL bupivacaine 0.5 %; 40 mg methylPREDNISolone acetate 40 MG/ML Procedure, treatment alternatives, risks and benefits explained, specific risks discussed. Consent was given by the patient. Immediately prior to procedure a time out was called to verify the correct patient, procedure, equipment, support staff and site/side marked as required. Patient was prepped and draped in the usual sterile fashion.       Clinical Data: No additional findings.   Subjective: Chief Complaint  Patient presents with  . Left Shoulder - Pain, Weakness    Joshua [name] is an 83 y o that presents with chronic left shoulder pain. Left hand  dominant. Pt relates no radiation pain is at the deltoid muscle area. Cannot do overhead raise or hand behind his back without pain.    HPI  Joshua [name] is a very pleasant 83 year old white male who is seen today for evaluation of his right shoulder.  He started having pain in his left shoulder more in the lateral aspect and deltoid area 4 months ago.  Apparently was having problems getting in and being seen so he went to Dr. Mervin Hack who on 16 October did a subacromial injection posterior approach.  He states he did have some benefit for a short period of time.  His pain is returned.  He is now having loss of motion and pain.  He is seen today for evaluation.  Review of Systems  Constitutional: Negative for fatigue.  HENT: Positive for ear discharge and hearing loss.   Respiratory: Negative for apnea, chest tightness and shortness of breath.   Cardiovascular: Negative for chest pain, palpitations and leg swelling.  Gastrointestinal: Negative for blood in stool, constipation and diarrhea.  Genitourinary: Negative for difficulty urinating.  Musculoskeletal: Positive for back pain. Negative for arthralgias, joint swelling, myalgias, neck pain and neck stiffness.  Neurological: Negative for weakness, numbness and headaches.  Hematological: Does not bruise/bleed easily.  Psychiatric/Behavioral: Negative for sleep disturbance. The patient is not nervous/anxious.      Objective: Vital Signs:  BP (!) 141/85   Pulse 70   Resp 14   Ht 5\' 10"  (1.778 m)   Wt 200 lb (90.7 kg)   BMI 28.70 kg/m   Physical Exam  Constitutional: He is oriented to person, place, and time. He appears well-developed and well-nourished.  HENT:  Head: Normocephalic and atraumatic.  Eyes: EOM are normal. Pupils are equal, round, and reactive to light.  Pulmonary/Chest: Effort normal.  Neurological: He is alert and oriented to person, place, and time.  Skin: Skin is warm and dry.  Psychiatric: He has a normal mood  and affect. His behavior is normal. Judgment and thought content normal.    Ortho Exam  Exam today reveals decreased range of motion of the left shoulder.  I can forward flex him to about 120 degrees, abduction to 90 degrees, external rotation with the arm at 90 degrees of abduction at around 50 degrees.  Internal rotation to only 20 degrees.  He is weak in external rotation and abduction.  Did not feel much in way of any crepitance.  He is not particularly painful in the shoulder girdle.  May have some atrophy in the supraspinatus.  Specialty Comments:  No specialty comments available.  Imaging: Xr Shoulder Left  Result Date: 02/18/2017 4 view x-ray of the left shoulder reveals he does have a joint space.  He does have a type II acromium.  On the axillary lateral he does have some changes of the humeral head more at the tuberosity.  This reviewed by Dr. Durward Fortes also.    PMFS History: Patient Active Problem List   Diagnosis Date Noted  . Diabetes ([place]) 06/05/2015  . Malignant neoplasm of prostate ([place]) 04/24/2015  . Hypothyroidism 04/24/2015  . Osteoarthritis 04/24/2015  . GERD (gastroesophageal reflux disease) 04/24/2015  . Hyperlipidemia 11/21/2014  . 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 Coronary artery disease involving native coronary artery without angina pectoris 11/21/2014  . Gastric ulcer 11/21/2014  . Bradycardia 11/21/2014   Past Medical History:  Diagnosis Date  . Asymptomatic stenosis of right carotid artery    MILD RIGHT ICA  <40%  PER DUPLEX STUDY  2003  . BPH (benign prostatic hypertrophy)   . CKD (chronic kidney disease), stage III ([place])   . Frequency of urination   . GERD (gastroesophageal reflux disease)   . H/O hiatal hernia   . History of gastric ulcer   . Hyperlipidemia   . Hypothyroidism   . Nocturia   . Short of breath on exertion   . Type 2 diabetes mellitus ([place])   . Urgency of urination     Family History  Problem Relation Age  of Onset  . Diabetes Mother   . Diabetes Father   . Heart disease Father   . Heart disease Brother     Past Surgical History:  Procedure Laterality Date  . CARDIAC CATHETERIZATION  01-07-2000  DR Daneen Schick   ESSENTIALLY NORMAL CORONARY ARTERIES W/ MINIMAL PLAQUING RCA & LAD/ NORMAL LVF/ SEVERE TORTUOSITY AORTOILIAC AND FEMORAL TERRITORY / FALSE ABNORMAL CARDIOLITE  . LUMBAR [place]  . TOTAL KNEE ARTHROPLASTY Bilateral LEFT  10-04-2004/   RIGHT  03-17-2008   Social History   Occupational History  . Not on file  Tobacco Use  . Smoking status: Never Smoker  . Smokeless tobacco: Never Used  Substance and Sexual Activity  . Alcohol use: No  . Drug use: No  . Sexual activity: Not on file

## 2017-02-19 ENCOUNTER — Telehealth (INDEPENDENT_AMBULATORY_CARE_PROVIDER_SITE_OTHER): Payer: Self-pay | Admitting: Orthopaedic Surgery

## 2017-02-19 NOTE — Telephone Encounter (Signed)
Physical therapy needs insurance cards faxed so they can schedule patient appts. Please send ASAP. Patient was not able to help. Fax#[phone]

## 2017-02-20 NOTE — Telephone Encounter (Signed)
faxed

## 2017-02-24 DIAGNOSIS — M25612 Stiffness of left shoulder, not elsewhere classified: Secondary | ICD-10-CM | POA: Diagnosis not present

## 2017-02-24 DIAGNOSIS — M6281 Muscle weakness (generalized): Secondary | ICD-10-CM | POA: Diagnosis not present

## 2017-02-24 DIAGNOSIS — M25512 Pain in left shoulder: Secondary | ICD-10-CM | POA: Diagnosis not present

## 2017-03-04 ENCOUNTER — Encounter: Payer: Self-pay | Admitting: Endocrinology

## 2017-03-04 ENCOUNTER — Ambulatory Visit: Payer: Medicare HMO | Admitting: Endocrinology

## 2017-03-04 VITALS — BP 126/60 | HR 79 | Ht 70.0 in | Wt 203.0 lb

## 2017-03-04 DIAGNOSIS — Z794 Long term (current) use of insulin: Secondary | ICD-10-CM

## 2017-03-04 DIAGNOSIS — E1122 Type 2 diabetes mellitus with diabetic chronic kidney disease: Secondary | ICD-10-CM | POA: Diagnosis not present

## 2017-03-04 DIAGNOSIS — N183 Chronic kidney disease, stage 3 (moderate): Secondary | ICD-10-CM

## 2017-03-04 LAB — POCT GLYCOSYLATED HEMOGLOBIN (HGB A1C): HEMOGLOBIN A1C: 10.4

## 2017-03-04 MED ORDER — LANTUS 100 UNIT/ML ~~LOC~~ SOLN: 60.0000 [IU] | SUBCUTANEOUS | 11 refills | Status: DC

## 2017-03-04 NOTE — Progress Notes (Signed)
Subjective:    Patient ID: Joshua [name], male    DOB: 1928-12-09, 83 y.o.   MRN: [id number]  HPI Pt returns for f/u of diabetes mellitus:  DM type: Insulin-requiring type 2.  Dx'ed: 1996.  Complications: polyneuropathy, CAD, retinopathy, and renal failure.  Therapy: insulin since 2014. DKA: never.   Severe hypoglycemia: never.  Pancreatitis: never.   Other: he takes QD insulin; no one is at home to give him multiple daily injections or to check cbg at any time other than fasting; on AM NPH, he had afternoon hypoglycemia; levemir was too expensive.  dtr prefers syringe and vial, as she draws up for him, and pt administers to himself.  dtr checks fasting cbg when she is there, but pt seldom checks cbg's at other times.  Interval history: dtr says he never misses the insulin.  Meter is downloaded today, and the printout is scanned into the record.  It varies from 70-200.  All are checked fasting. He takes lantus, 50 units qd, and no other insulin.  dtr provides hx, due pt's poor health.  Past Medical History:  Diagnosis Date  . Asymptomatic stenosis of right carotid artery    MILD RIGHT ICA  <40%  PER DUPLEX STUDY  2003  . BPH (benign prostatic hypertrophy)   . CKD (chronic kidney disease), stage III ([place])   . Frequency of urination   . GERD (gastroesophageal reflux disease)   . H/O hiatal hernia   . History of gastric ulcer   . Hyperlipidemia   . Hypothyroidism   . Nocturia   . Short of breath on exertion   . Type 2 diabetes mellitus ([place])   . Urgency of urination     Past Surgical History:  Procedure Laterality Date  . CARDIAC CATHETERIZATION  01-07-2000  DR Daneen Schick   ESSENTIALLY NORMAL CORONARY ARTERIES W/ MINIMAL PLAQUING RCA & LAD/ NORMAL LVF/ SEVERE TORTUOSITY AORTOILIAC AND FEMORAL TERRITORY / FALSE ABNORMAL CARDIOLITE  . LUMBAR [place]  . TOTAL KNEE ARTHROPLASTY Bilateral LEFT  10-04-2004/   RIGHT  03-17-2008  . TRANSURETHRAL RESECTION OF PROSTATE  N/A 02/01/2013   Procedure: TRANSURETHRAL RESECTION OF THE PROSTATEAND TURB  WITH GYRUS INSTRUMENTS;  Surgeon: Bernestine Amass, MD;  Location: [facility];  Service: Urology;  Laterality: N/A;    Social History   Socioeconomic History  . Marital status: Married    Spouse name: Not on file  . Number of children: Not on file  . Years of education: Not on file  . Highest education level: Not on file  Social Needs  . Financial resource strain: Not on file  . Food insecurity - worry: Not on file  . Food insecurity - inability: Not on file  . Transportation needs - medical: Not on file  . Transportation needs - non-medical: Not on file  Occupational History  . Not on file  Tobacco Use  . Smoking status: Never Smoker  . Smokeless tobacco: Never Used  Substance and Sexual Activity  . Alcohol use: No  . Drug use: No  . Sexual activity: Not on file  Other Topics Concern  . Not on file  Social History Narrative  . Not on file    Current Outpatient Medications on File Prior to Visit  Medication Sig Dispense Refill  . donepezil (ARICEPT) 10 MG tablet Take 1 tablet (10 mg total) by mouth at bedtime. 90 tablet 3  . levothyroxine (SYNTHROID, LEVOTHROID) 75 MCG tablet Take 75  mcg by mouth daily before breakfast.   3  . simvastatin (ZOCOR) 40 MG tablet Take 20 mg by mouth every evening.    . vitamin B-12 (CYANOCOBALAMIN) 1000 MCG tablet Take 1,000 mcg by mouth daily.     No current facility-administered medications on file prior to visit.     No Known Allergies  Family History  Problem Relation Age of Onset  . Diabetes Mother   . Diabetes Father   . Heart disease Father   . Heart disease Brother     BP 126/60   Pulse 79   Ht 5\' 10"  (1.778 m)   Wt 203 lb (92.1 kg)   SpO2 92%   BMI 29.13 kg/m   Review of Systems He denies hypoglycemia    Objective:   Physical Exam VITAL SIGNS:  See vs page.  GENERAL: no distress.  Pulses: foot pulses are intact  bilaterally.   MSK: no deformity of the feet or ankles.  CV: trace bilat leg edema of the legs, and bilat vv's.  Skin:  no ulcer on the feet or ankles.  normal color and temp on the feet and ankles Neuro: sensation is intact to touch on the feet and ankles.   Ext: There is bilateral onychomycosis of the toenails.   Lab Results  Component Value Date   HGBA1C 10.4 03/04/2017      Assessment & Plan:  Insulin-requiring type 2 DM: he needs increased rx.  Memory loss: this limits rx.  Frail elderly state: he is not a candidate for aggressive glycemic control.   Patient Instructions  check your blood sugar twice a day.  vary the time of day when you check, between before the 3 meals, and at bedtime.  also check if you have symptoms of your blood sugar being too high or too low.  please keep a record of the readings and bring it to your next appointment here (or you can bring the meter itself).  You can write it on any piece of paper.  please call us sooner if your blood sugar goes below 70, or if you have a lot of readings over 200.  Please increase the lantus to 60 units each morning.  To avoid lows, eat a light snack at bedtime.   On this type of insulin schedule, you should eat meals on a regular schedule.  If a meal is missed or significantly delayed, your blood sugar could go low.   Please come back for a follow-up appointment in 2 months.

## 2017-03-04 NOTE — Patient Instructions (Addendum)
check your blood sugar twice a day.  vary the time of day when you check, between before the 3 meals, and at bedtime.  also check if you have symptoms of your blood sugar being too high or too low.  please keep a record of the readings and bring it to your next appointment here (or you can bring the meter itself).  You can write it on any piece of paper.  please call us sooner if your blood sugar goes below 70, or if you have a lot of readings over 200.  Please increase the lantus to 60 units each morning.  To avoid lows, eat a light snack at bedtime.   On this type of insulin schedule, you should eat meals on a regular schedule.  If a meal is missed or significantly delayed, your blood sugar could go low.   Please come back for a follow-up appointment in 2 months.

## 2017-03-11 ENCOUNTER — Telehealth: Payer: Self-pay

## 2017-03-11 NOTE — Telephone Encounter (Signed)
Faxed 2 times

## 2017-03-11 NOTE — Telephone Encounter (Signed)
Rip Harbour with [facility] stated that patient may have went to [address] location for physical therapy.  Cb# is [phone].  Please advise.  Thank you.

## 2017-03-14 ENCOUNTER — Telehealth (INDEPENDENT_AMBULATORY_CARE_PROVIDER_SITE_OTHER): Payer: Self-pay

## 2017-03-14 NOTE — Telephone Encounter (Signed)
Elisa with [facility] needs patient's demographic information and insurance faxed to [phone] to schedule patient appointment..  Cb# is [phone].  Please advise.  Thank you.

## 2017-03-14 NOTE — Telephone Encounter (Signed)
sent 

## 2017-03-17 ENCOUNTER — Encounter (INDEPENDENT_AMBULATORY_CARE_PROVIDER_SITE_OTHER): Payer: Self-pay | Admitting: Orthopaedic Surgery

## 2017-03-17 ENCOUNTER — Ambulatory Visit (INDEPENDENT_AMBULATORY_CARE_PROVIDER_SITE_OTHER): Payer: Medicare HMO | Admitting: Orthopaedic Surgery

## 2017-03-17 VITALS — BP 162/74 | HR 57 | Resp 14 | Ht 70.0 in | Wt 200.0 lb

## 2017-03-17 DIAGNOSIS — G8929 Other chronic pain: Secondary | ICD-10-CM

## 2017-03-17 DIAGNOSIS — M25512 Pain in left shoulder: Secondary | ICD-10-CM

## 2017-03-17 NOTE — Progress Notes (Signed)
Office Visit Note   Patient: Joshua [name]           Date of Birth: 04-20-1928           MRN: [id number] Visit Date: 03/17/2017              Requested by: Gaynelle Arabian, MD [address] [place], [place] [address] PCP: Gaynelle Arabian, MD   Assessment & Plan: Visit Diagnoses:  1. Chronic left shoulder pain     Plan: Left shoulder pain with possible rotator cuff tear and possible mild osteoarthritis. Still having some pain after subacromial cortisone injection. Long discussion with Joshua [name] and his son. We decided to proceed with further physical therapy and then reevaluate in 6-8 weeks. Have discussed possible MRI scan and its implications. Family happy with present approach  Follow-Up Instructions: Return if symptoms worsen or fail to improve.   Orders:  No orders of the defined types were placed in this encounter.  No orders of the defined types were placed in this encounter.     Procedures: No procedures performed   Clinical Data: No additional findings.   Subjective: Chief Complaint  Patient presents with  . Left Shoulder - Follow-up, Pain    Joshua [name] is an 83 y o here for Chronic Left shoulder pain. Pt one visit;   Seen about a month ago with a cortisone injection left shoulder. Some relief but not "complete". Still having some difficulty with overhead motion of his shoulder  HPI  Review of Systems  Constitutional: Negative for fatigue.  HENT: Negative for hearing loss.   Respiratory: Negative for apnea, chest tightness and shortness of breath.   Cardiovascular: Negative for chest pain, palpitations and leg swelling.  Gastrointestinal: Negative for blood in stool, constipation and diarrhea.  Genitourinary: Negative for difficulty urinating.  Musculoskeletal: Negative for arthralgias, back pain, joint swelling, myalgias, neck pain and neck stiffness.  Neurological: Negative for weakness, numbness and headaches.    Hematological: Does not bruise/bleed easily.  Psychiatric/Behavioral: Negative for sleep disturbance. The patient is not nervous/anxious.      Objective: Vital Signs: BP (!) 162/74   Pulse (!) 57   Resp 14   Ht 5\' 10"  (1.778 m)   Wt 200 lb (90.7 kg)   BMI 28.70 kg/m   Physical Exam  Ortho Exam somewhat confused. Able to raise his left arm overhead with her circuitous motion. Positive impingement. Some tenderness over the anterior lateral subacromial region but tickly with abduction. No crepitation. Skin intact. Good grip and good release.  Specialty Comments:  No specialty comments available.  Imaging: No results found.   PMFS History: Patient Active Problem List   Diagnosis Date Noted  . Diabetes ([place]) 06/05/2015  . Malignant neoplasm of prostate ([place]) 04/24/2015  . Hypothyroidism 04/24/2015  . Osteoarthritis 04/24/2015  . GERD (gastroesophageal reflux disease) 04/24/2015  . Hyperlipidemia 11/21/2014  . 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 Coronary artery disease involving native coronary artery without angina pectoris 11/21/2014  . Gastric ulcer 11/21/2014  . Bradycardia 11/21/2014   Past Medical History:  Diagnosis Date  . Asymptomatic stenosis of right carotid artery    MILD RIGHT ICA  <40%  PER DUPLEX STUDY  2003  . BPH (benign prostatic hypertrophy)   . CKD (chronic kidney disease), stage III ([place])   . Frequency of urination   . GERD (gastroesophageal reflux disease)   . H/O hiatal hernia   . History of gastric ulcer   .  Hyperlipidemia   . Hypothyroidism   . Nocturia   . Short of breath on exertion   . Type 2 diabetes mellitus ([place])   . Urgency of urination     Family History  Problem Relation Age of Onset  . Diabetes Mother   . Diabetes Father   . Heart disease Father   . Heart disease Brother     Past Surgical History:  Procedure Laterality Date  . CARDIAC CATHETERIZATION  01-07-2000  DR Daneen Schick   ESSENTIALLY NORMAL CORONARY  ARTERIES W/ MINIMAL PLAQUING RCA & LAD/ NORMAL LVF/ SEVERE TORTUOSITY AORTOILIAC AND FEMORAL TERRITORY / FALSE ABNORMAL CARDIOLITE  . LUMBAR [place]  . TOTAL KNEE ARTHROPLASTY Bilateral LEFT  10-04-2004/   RIGHT  03-17-2008  . TRANSURETHRAL RESECTION OF PROSTATE N/A 02/01/2013   Procedure: TRANSURETHRAL RESECTION OF THE PROSTATEAND TURB  WITH GYRUS INSTRUMENTS;  Surgeon: Bernestine Amass, MD;  Location: [facility];  Service: Urology;  Laterality: N/A;   Social History   Occupational History  . Not on file  Tobacco Use  . Smoking status: Never Smoker  . Smokeless tobacco: Never Used  Substance and Sexual Activity  . Alcohol use: No  . Drug use: No  . Sexual activity: Not on file

## 2017-03-18 ENCOUNTER — Other Ambulatory Visit (INDEPENDENT_AMBULATORY_CARE_PROVIDER_SITE_OTHER): Payer: Self-pay

## 2017-03-18 DIAGNOSIS — G8929 Other chronic pain: Secondary | ICD-10-CM

## 2017-03-18 DIAGNOSIS — M25512 Pain in left shoulder: Principal | ICD-10-CM

## 2017-03-19 DIAGNOSIS — H6123 Impacted cerumen, bilateral: Secondary | ICD-10-CM | POA: Diagnosis not present

## 2017-03-28 ENCOUNTER — Encounter: Payer: Self-pay | Admitting: Endocrinology

## 2017-03-28 DIAGNOSIS — E1129 Type 2 diabetes mellitus with other diabetic kidney complication: Secondary | ICD-10-CM | POA: Diagnosis not present

## 2017-03-28 DIAGNOSIS — E1149 Type 2 diabetes mellitus with other diabetic neurological complication: Secondary | ICD-10-CM | POA: Diagnosis not present

## 2017-03-28 DIAGNOSIS — G629 Polyneuropathy, unspecified: Secondary | ICD-10-CM | POA: Diagnosis not present

## 2017-03-28 DIAGNOSIS — R69 Illness, unspecified: Secondary | ICD-10-CM | POA: Diagnosis not present

## 2017-03-28 DIAGNOSIS — E7800 Pure hypercholesterolemia, unspecified: Secondary | ICD-10-CM | POA: Diagnosis not present

## 2017-03-28 DIAGNOSIS — R809 Proteinuria, unspecified: Secondary | ICD-10-CM | POA: Diagnosis not present

## 2017-03-28 DIAGNOSIS — M1990 Unspecified osteoarthritis, unspecified site: Secondary | ICD-10-CM | POA: Diagnosis not present

## 2017-03-28 DIAGNOSIS — N183 Chronic kidney disease, stage 3 (moderate): Secondary | ICD-10-CM | POA: Diagnosis not present

## 2017-03-28 DIAGNOSIS — E039 Hypothyroidism, unspecified: Secondary | ICD-10-CM | POA: Diagnosis not present

## 2017-03-31 DIAGNOSIS — M25512 Pain in left shoulder: Secondary | ICD-10-CM | POA: Diagnosis not present

## 2017-03-31 DIAGNOSIS — M25612 Stiffness of left shoulder, not elsewhere classified: Secondary | ICD-10-CM | POA: Diagnosis not present

## 2017-03-31 DIAGNOSIS — M6281 Muscle weakness (generalized): Secondary | ICD-10-CM | POA: Diagnosis not present

## 2017-04-03 DIAGNOSIS — M25612 Stiffness of left shoulder, not elsewhere classified: Secondary | ICD-10-CM | POA: Diagnosis not present

## 2017-04-03 DIAGNOSIS — M6281 Muscle weakness (generalized): Secondary | ICD-10-CM | POA: Diagnosis not present

## 2017-04-03 DIAGNOSIS — M25512 Pain in left shoulder: Secondary | ICD-10-CM | POA: Diagnosis not present

## 2017-04-04 DIAGNOSIS — E119 Type 2 diabetes mellitus without complications: Secondary | ICD-10-CM | POA: Diagnosis not present

## 2017-04-19 DIAGNOSIS — R69 Illness, unspecified: Secondary | ICD-10-CM | POA: Diagnosis not present

## 2017-05-06 DIAGNOSIS — E119 Type 2 diabetes mellitus without complications: Secondary | ICD-10-CM | POA: Diagnosis not present

## 2017-05-06 DIAGNOSIS — H353221 Exudative age-related macular degeneration, left eye, with active choroidal neovascularization: Secondary | ICD-10-CM | POA: Diagnosis not present

## 2017-05-06 DIAGNOSIS — H353133 Nonexudative age-related macular degeneration, bilateral, advanced atrophic without subfoveal involvement: Secondary | ICD-10-CM | POA: Diagnosis not present

## 2017-05-06 DIAGNOSIS — H353222 Exudative age-related macular degeneration, left eye, with inactive choroidal neovascularization: Secondary | ICD-10-CM | POA: Diagnosis not present

## 2017-05-07 ENCOUNTER — Telehealth: Payer: Self-pay | Admitting: Endocrinology

## 2017-05-07 ENCOUNTER — Other Ambulatory Visit: Payer: Self-pay

## 2017-05-07 MED ORDER — INSULIN PEN NEEDLE 32G X 4 MM MISC: 11 refills | Status: AC

## 2017-05-07 MED ORDER — BASAGLAR KWIKPEN 100 UNIT/ML ~~LOC~~ SOPN: PEN_INJECTOR | SUBCUTANEOUS | 11 refills | Status: DC

## 2017-05-07 NOTE — Telephone Encounter (Signed)
Pts daughter came in with pt for appt. The appt was never made in our system. Pt will need a refill for his insulin. They state the LANTUS 100 UNIT/ML injection Is not covered with insurance, that the dr stated at last visit he is changing it,  They need that sent in. They made an appt for 2/14 and need his script before that visit.  [id number] [address], [place]   Please advise [phone] Santiago Glad (daughter)

## 2017-05-07 NOTE — Telephone Encounter (Signed)
I spoke with patient's daughter & I have sent over prescription for basaglar plus pen needles.

## 2017-05-29 ENCOUNTER — Ambulatory Visit (INDEPENDENT_AMBULATORY_CARE_PROVIDER_SITE_OTHER): Payer: Medicare HMO | Admitting: Endocrinology

## 2017-05-29 ENCOUNTER — Encounter: Payer: Self-pay | Admitting: Endocrinology

## 2017-05-29 VITALS — BP 130/82 | HR 58 | Wt 206.0 lb

## 2017-05-29 DIAGNOSIS — H43812 Vitreous degeneration, left eye: Secondary | ICD-10-CM | POA: Diagnosis not present

## 2017-05-29 DIAGNOSIS — E1122 Type 2 diabetes mellitus with diabetic chronic kidney disease: Secondary | ICD-10-CM

## 2017-05-29 DIAGNOSIS — Z794 Long term (current) use of insulin: Secondary | ICD-10-CM | POA: Diagnosis not present

## 2017-05-29 DIAGNOSIS — N183 Chronic kidney disease, stage 3 unspecified: Secondary | ICD-10-CM

## 2017-05-29 DIAGNOSIS — H353221 Exudative age-related macular degeneration, left eye, with active choroidal neovascularization: Secondary | ICD-10-CM | POA: Diagnosis not present

## 2017-05-29 DIAGNOSIS — H353222 Exudative age-related macular degeneration, left eye, with inactive choroidal neovascularization: Secondary | ICD-10-CM | POA: Diagnosis not present

## 2017-05-29 DIAGNOSIS — E119 Type 2 diabetes mellitus without complications: Secondary | ICD-10-CM | POA: Diagnosis not present

## 2017-05-29 LAB — POCT GLYCOSYLATED HEMOGLOBIN (HGB A1C): Hemoglobin A1C: 10.8

## 2017-05-29 NOTE — Patient Instructions (Addendum)
check your blood sugar twice a day.  vary the time of day when you check, between before the 3 meals, and at bedtime.  also check if you have symptoms of your blood sugar being too high or too low.  please keep a record of the readings and bring it to your next appointment here (or you can bring the meter itself).  You can write it on any piece of paper.  please call us sooner if your blood sugar goes below 70, or if you have a lot of readings over 200.  To avoid low blood sugar in the middle of the night, eat a light snack at bedtime.   Please continue the same insulin.   On this type of insulin schedule, you should eat meals on a regular schedule.  If a meal is missed or significantly delayed, your blood sugar could go low.   Please come back for a follow-up appointment in 3 months.   

## 2017-05-29 NOTE — Progress Notes (Signed)
Subjective:    Patient ID: Joshua [name], male    DOB: 1928-08-20, 84 y.o.   MRN: [id number]  HPI Pt returns for f/u of diabetes mellitus:  DM type: Insulin-requiring type 2.  Dx'ed: 1996.  Complications: polyneuropathy, CAD, retinopathy, and renal failure.  Therapy: insulin since 2014. DKA: never.   Severe hypoglycemia: never.  Pancreatitis: never.   Other: he takes QD insulin; no one is at home to give him multiple daily injections or to check cbg at any time other than fasting; on AM NPH, he had afternoon hypoglycemia; levemir was too expensive.  dtr prefers syringe and vial, as she draws up for him, and pt administers to himself.  dtr checks fasting cbg when she is there, but pt does not check cbg's at other times.  Interval history: dtr says he never misses the insulin.  Meter is downloaded today, and the printout is scanned into the record.  It varies from 90-300.  All are checked fasting. He takes lantus, 60 units qd, and no other insulin.  dtr provides hx, due pt's poor health.  Past Medical History:  Diagnosis Date  . Asymptomatic stenosis of right carotid artery    MILD RIGHT ICA  <40%  PER DUPLEX STUDY  2003  . BPH (benign prostatic hypertrophy)   . CKD (chronic kidney disease), stage III ([place])   . Frequency of urination   . GERD (gastroesophageal reflux disease)   . H/O hiatal hernia   . History of gastric ulcer   . Hyperlipidemia   . Hypothyroidism   . Nocturia   . Short of breath on exertion   . Type 2 diabetes mellitus ([place])   . Urgency of urination     Past Surgical History:  Procedure Laterality Date  . CARDIAC CATHETERIZATION  01-07-2000  DR Daneen Schick   ESSENTIALLY NORMAL CORONARY ARTERIES W/ MINIMAL PLAQUING RCA & LAD/ NORMAL LVF/ SEVERE TORTUOSITY AORTOILIAC AND FEMORAL TERRITORY / FALSE ABNORMAL CARDIOLITE  . LUMBAR [place]  . TOTAL KNEE ARTHROPLASTY Bilateral LEFT  10-04-2004/   RIGHT  03-17-2008  . TRANSURETHRAL RESECTION OF PROSTATE  N/A 02/01/2013   Procedure: TRANSURETHRAL RESECTION OF THE PROSTATEAND TURB  WITH GYRUS INSTRUMENTS;  Surgeon: Bernestine Amass, MD;  Location: [facility];  Service: Urology;  Laterality: N/A;    Social History   Socioeconomic History  . Marital status: Married    Spouse name: Not on file  . Number of children: Not on file  . Years of education: Not on file  . Highest education level: Not on file  Social Needs  . Financial resource strain: Not on file  . Food insecurity - worry: Not on file  . Food insecurity - inability: Not on file  . Transportation needs - medical: Not on file  . Transportation needs - non-medical: Not on file  Occupational History  . Not on file  Tobacco Use  . Smoking status: Never Smoker  . Smokeless tobacco: Never Used  Substance and Sexual Activity  . Alcohol use: No  . Drug use: No  . Sexual activity: Not on file  Other Topics Concern  . Not on file  Social History Narrative  . Not on file    Current Outpatient Medications on File Prior to Visit  Medication Sig Dispense Refill  . B-D INS SYR ULTRAFINE 1CC/31G 31G X 5/16" 1 ML MISC USE WITH LEVEMIR ONCE DAILY  11  . donepezil (ARICEPT) 10 MG tablet Take 1  tablet (10 mg total) by mouth at bedtime. 90 tablet 3  . Insulin Glargine (BASAGLAR KWIKPEN) 100 UNIT/ML SOPN Inject 60 units into the skin every morning. 10 pen 11  . Insulin Pen Needle 32G X 4 MM MISC Used to give insulin injections once a day. 100 each 11  . levothyroxine (SYNTHROID, LEVOTHROID) 75 MCG tablet Take 75 mcg by mouth daily before breakfast.   3  . simvastatin (ZOCOR) 40 MG tablet Take 20 mg by mouth every evening.    . vitamin B-12 (CYANOCOBALAMIN) 1000 MCG tablet Take 1,000 mcg by mouth daily.     No current facility-administered medications on file prior to visit.     Not on File  Family History  Problem Relation Age of Onset  . Diabetes Mother   . Diabetes Father   . Heart disease Father   . Heart disease  Brother     BP 130/82 (BP Location: Right Arm, Patient Position: Sitting, Cuff Size: Normal)   Pulse (!) 58   Wt 206 lb (93.4 kg)   SpO2 97%   BMI 29.56 kg/m    Review of Systems He denies hypoglycemia.     Objective:   Physical Exam VITAL SIGNS:  See vs page.  GENERAL: no distress.  Pulses: foot pulses are intact bilaterally.   MSK: no deformity of the feet or ankles.  CV: trace bilat leg edema of the legs, and bilat vv's.  Skin:  no ulcer on the feet or ankles.  normal color and temp on the feet and ankles.  Neuro: sensation is intact to touch on the feet and ankles, but decreased from normal.   Ext: There is bilateral onychomycosis of the toenails.    Lab Results  Component Value Date   HGBA1C 10.8 05/29/2017       Assessment & Plan:  Insulin-requiring type 2 DM, with renal failure: this is the best control this pt should aim for, given the below: Frail elderly status: he would not benefit from aggressive glycemic control Hypoglycemia: this also limits aggressiveness of glycemic control  Patient Instructions  check your blood sugar twice a day.  vary the time of day when you check, between before the 3 meals, and at bedtime.  also check if you have symptoms of your blood sugar being too high or too low.  please keep a record of the readings and bring it to your next appointment here (or you can bring the meter itself).  You can write it on any piece of paper.  please call us sooner if your blood sugar goes below 70, or if you have a lot of readings over 200.  To avoid low blood sugar in the middle of the night, eat a light snack at bedtime.   Please continue the same insulin.   On this type of insulin schedule, you should eat meals on a regular schedule.  If a meal is missed or significantly delayed, your blood sugar could go low.   Please come back for a follow-up appointment in 3 months.

## 2017-06-04 ENCOUNTER — Telehealth: Payer: Self-pay | Admitting: Endocrinology

## 2017-06-04 DIAGNOSIS — R69 Illness, unspecified: Secondary | ICD-10-CM | POA: Diagnosis not present

## 2017-06-04 NOTE — Telephone Encounter (Signed)
We could increase the insulin, but I hesitate to do so, because of the possibility it could go low.  The best way to get it lower fast is to drink plenty of fluids.  Is there any reason you can think of why it is high, such as steroids?

## 2017-06-04 NOTE — Telephone Encounter (Signed)
Santiago Glad is the one calling # [phone]

## 2017-06-04 NOTE — Telephone Encounter (Signed)
Pt BS was 179 this am this afternoon at about 2 pm it was 514 and just now it is 476.  Pt did take his AM dose of insulin.  FYI every morning after taking the insulin he feels like his head is full his eyes feel heavy not tired heavy but full?  Please advise

## 2017-06-04 NOTE — Telephone Encounter (Signed)
I called and gave instructions to patient's daughter. I told her to make sure that he drank plenty of water & stayed away from sugars or carbs. If blood sugars got too high & wouldn't register on monitor I advised ER. Otherwise let us know if blood sugars come down tomorrow.

## 2017-06-12 ENCOUNTER — Telehealth: Payer: Self-pay | Admitting: Endocrinology

## 2017-06-12 NOTE — Telephone Encounter (Signed)
I spoke to Joshua [name]- patient's blood sugar was 520 mg/dL at 1:30 today. She states he feels sluggish and has been increasing fluid intake today.   Please advise.

## 2017-06-12 NOTE — Telephone Encounter (Signed)
Pt's daughter, Santiago Glad informed of below, to have pt take 5 more units of insulin now and to call 911 if his current sxs worsen or he develops any new sxs.

## 2017-06-12 NOTE — Telephone Encounter (Signed)
Please increase to 65 units qam

## 2017-06-12 NOTE — Telephone Encounter (Signed)
Santiago Glad (pt's daughter) called-she is very concerned because pt's blood sugar is running high. Please call Santiago Glad at ph# [phone] to advise.

## 2017-06-28 DIAGNOSIS — R69 Illness, unspecified: Secondary | ICD-10-CM | POA: Diagnosis not present

## 2017-07-17 DIAGNOSIS — D649 Anemia, unspecified: Secondary | ICD-10-CM | POA: Diagnosis not present

## 2017-07-17 DIAGNOSIS — R030 Elevated blood-pressure reading, without diagnosis of hypertension: Secondary | ICD-10-CM | POA: Diagnosis not present

## 2017-07-17 DIAGNOSIS — E1159 Type 2 diabetes mellitus with other circulatory complications: Secondary | ICD-10-CM | POA: Diagnosis not present

## 2017-07-17 DIAGNOSIS — E039 Hypothyroidism, unspecified: Secondary | ICD-10-CM | POA: Diagnosis not present

## 2017-07-17 DIAGNOSIS — E785 Hyperlipidemia, unspecified: Secondary | ICD-10-CM | POA: Diagnosis not present

## 2017-07-17 DIAGNOSIS — R69 Illness, unspecified: Secondary | ICD-10-CM | POA: Diagnosis not present

## 2017-07-17 DIAGNOSIS — K219 Gastro-esophageal reflux disease without esophagitis: Secondary | ICD-10-CM | POA: Diagnosis not present

## 2017-07-17 DIAGNOSIS — Z794 Long term (current) use of insulin: Secondary | ICD-10-CM | POA: Diagnosis not present

## 2017-07-28 DIAGNOSIS — H33011 Retinal detachment with single break, right eye: Secondary | ICD-10-CM | POA: Diagnosis not present

## 2017-07-28 DIAGNOSIS — E119 Type 2 diabetes mellitus without complications: Secondary | ICD-10-CM | POA: Diagnosis not present

## 2017-07-28 DIAGNOSIS — H353222 Exudative age-related macular degeneration, left eye, with inactive choroidal neovascularization: Secondary | ICD-10-CM | POA: Diagnosis not present

## 2017-07-28 DIAGNOSIS — H353133 Nonexudative age-related macular degeneration, bilateral, advanced atrophic without subfoveal involvement: Secondary | ICD-10-CM | POA: Diagnosis not present

## 2017-07-28 DIAGNOSIS — H353212 Exudative age-related macular degeneration, right eye, with inactive choroidal neovascularization: Secondary | ICD-10-CM | POA: Diagnosis not present

## 2017-08-24 DIAGNOSIS — R69 Illness, unspecified: Secondary | ICD-10-CM | POA: Diagnosis not present

## 2017-08-26 ENCOUNTER — Ambulatory Visit: Payer: Medicare HMO | Admitting: Endocrinology

## 2017-08-26 ENCOUNTER — Encounter: Payer: Self-pay | Admitting: Endocrinology

## 2017-08-26 VITALS — BP 122/60 | HR 60 | Wt 194.0 lb

## 2017-08-26 DIAGNOSIS — N183 Chronic kidney disease, stage 3 (moderate): Secondary | ICD-10-CM

## 2017-08-26 DIAGNOSIS — Z794 Long term (current) use of insulin: Secondary | ICD-10-CM | POA: Diagnosis not present

## 2017-08-26 DIAGNOSIS — E1122 Type 2 diabetes mellitus with diabetic chronic kidney disease: Secondary | ICD-10-CM

## 2017-08-26 LAB — POCT GLYCOSYLATED HEMOGLOBIN (HGB A1C): HEMOGLOBIN A1C: 10.3

## 2017-08-26 MED ORDER — BASAGLAR KWIKPEN 100 UNIT/ML ~~LOC~~ SOPN: 65.0000 [IU] | PEN_INJECTOR | SUBCUTANEOUS | 11 refills | Status: DC

## 2017-08-26 NOTE — Patient Instructions (Signed)
check your blood sugar twice a day.  vary the time of day when you check, between before the 3 meals, and at bedtime.  also check if you have symptoms of your blood sugar being too high or too low.  please keep a record of the readings and bring it to your next appointment here (or you can bring the meter itself).  You can write it on any piece of paper.  please call us sooner if your blood sugar goes below 70, or if you have a lot of readings over 200.  To avoid low blood sugar in the middle of the night, eat a light snack at bedtime.   Please continue the same insulin.   On this type of insulin schedule, you should eat meals on a regular schedule.  If a meal is missed or significantly delayed, your blood sugar could go low.   Please come back for a follow-up appointment in 3 months.   

## 2017-08-26 NOTE — Progress Notes (Signed)
Subjective:    Patient ID: Joshua [name], male    DOB: 1928/06/21, 84 y.o.   MRN: [id number]  HPI Pt returns for f/u of diabetes mellitus:  DM type: Insulin-requiring type 2.  Dx'ed: 1996.  Complications: polyneuropathy, CAD, retinopathy, and renal failure.  Therapy: insulin since 2014. DKA: never.   Severe hypoglycemia: never.  Pancreatitis: never.   Other: he takes QD insulin; no one is at home to give him multiple daily injections or to check cbg at any time other than fasting; on AM NPH, he had afternoon hypoglycemia; levemir was too expensive.  dtr prefers syringe and vial, as she draws up for him, and pt administers to himself.  dtr checks fasting cbg when she is there, but pt does not check cbg's at other times.  Interval history: dtr says he never misses the insulin.  He says fasting cbg varies from 120-180.  dtr provides hx, due pt's poor health. Main symptom is fatigue.   Past Medical History:  Diagnosis Date  . Asymptomatic stenosis of right carotid artery    MILD RIGHT ICA  <40%  PER DUPLEX STUDY  2003  . BPH (benign prostatic hypertrophy)   . CKD (chronic kidney disease), stage III ([place])   . Frequency of urination   . GERD (gastroesophageal reflux disease)   . H/O hiatal hernia   . History of gastric ulcer   . Hyperlipidemia   . Hypothyroidism   . Nocturia   . Short of breath on exertion   . Type 2 diabetes mellitus ([place])   . Urgency of urination     Past Surgical History:  Procedure Laterality Date  . CARDIAC CATHETERIZATION  01-07-2000  DR Daneen Schick   ESSENTIALLY NORMAL CORONARY ARTERIES W/ MINIMAL PLAQUING RCA & LAD/ NORMAL LVF/ SEVERE TORTUOSITY AORTOILIAC AND FEMORAL TERRITORY / FALSE ABNORMAL CARDIOLITE  . LUMBAR [place]  . TOTAL KNEE ARTHROPLASTY Bilateral LEFT  10-04-2004/   RIGHT  03-17-2008  . TRANSURETHRAL RESECTION OF PROSTATE N/A 02/01/2013   Procedure: TRANSURETHRAL RESECTION OF THE PROSTATEAND TURB  WITH GYRUS INSTRUMENTS;   Surgeon: Bernestine Amass, MD;  Location: [facility];  Service: Urology;  Laterality: N/A;    Social History   Socioeconomic History  . Marital status: Married    Spouse name: Not on file  . Number of children: Not on file  . Years of education: Not on file  . Highest education level: Not on file  Occupational History  . Not on file  Social Needs  . Financial resource strain: Not on file  . Food insecurity:    Worry: Not on file    Inability: Not on file  . Transportation needs:    Medical: Not on file    Non-medical: Not on file  Tobacco Use  . Smoking status: Never Smoker  . Smokeless tobacco: Never Used  Substance and Sexual Activity  . Alcohol use: No  . Drug use: No  . Sexual activity: Not on file  Lifestyle  . Physical activity:    Days per week: Not on file    Minutes per session: Not on file  . Stress: Not on file  Relationships  . Social connections:    Talks on phone: Not on file    Gets together: Not on file    Attends religious service: Not on file    Active member of club or organization: Not on file    Attends meetings of clubs or organizations:  Not on file    Relationship status: Not on file  . Intimate partner violence:    Fear of current or ex partner: Not on file    Emotionally abused: Not on file    Physically abused: Not on file    Forced sexual activity: Not on file  Other Topics Concern  . Not on file  Social History Narrative  . Not on file    Current Outpatient Medications on File Prior to Visit  Medication Sig Dispense Refill  . B-D INS SYR ULTRAFINE 1CC/31G 31G X 5/16" 1 ML MISC USE WITH LEVEMIR ONCE DAILY  11  . donepezil (ARICEPT) 10 MG tablet Take 1 tablet (10 mg total) by mouth at bedtime. 90 tablet 3  . Insulin Pen Needle 32G X 4 MM MISC Used to give insulin injections once a day. 100 each 11  . levothyroxine (SYNTHROID, LEVOTHROID) 75 MCG tablet Take 75 mcg by mouth daily before breakfast.   3  . simvastatin  (ZOCOR) 40 MG tablet Take 20 mg by mouth every evening.    . vitamin B-12 (CYANOCOBALAMIN) 1000 MCG tablet Take 1,000 mcg by mouth daily.     No current facility-administered medications on file prior to visit.     No Known Allergies  Family History  Problem Relation Age of Onset  . Diabetes Mother   . Diabetes Father   . Heart disease Father   . Heart disease Brother     BP 122/60 (BP Location: Left Arm, Patient Position: Sitting, Cuff Size: Normal)   Pulse 60   Wt 194 lb (88 kg)   SpO2 94%   BMI 27.84 kg/m   Review of Systems He denies hypoglycemia    Objective:   Physical Exam VITAL SIGNS:  See vs page.  GENERAL: no distress.  Pulses: foot pulses are intact bilaterally.   MSK: no deformity of the feet or ankles.  CV: 1+ bilat leg edema of the legs, and bilat vv's.  Skin:  no ulcer on the feet or ankles.  normal color and temp on the feet and ankles.  Neuro: sensation is intact to touch on the feet and ankles, but decreased from normal.   Ext: There is bilateral onychomycosis of the toenails.    Lab Results  Component Value Date   HGBA1C 10.3 08/26/2017       Assessment & Plan:  Insulin-requiring type 2 DM: poor control.  We discussed.  We agree that treatment goals are prevention of severe hypoglycemia and hyperglycemia.    Patient Instructions  check your blood sugar twice a day.  vary the time of day when you check, between before the 3 meals, and at bedtime.  also check if you have symptoms of your blood sugar being too high or too low.  please keep a record of the readings and bring it to your next appointment here (or you can bring the meter itself).  You can write it on any piece of paper.  please call us sooner if your blood sugar goes below 70, or if you have a lot of readings over 200.  To avoid low blood sugar in the middle of the night, eat a light snack at bedtime.   Please continue the same insulin.   On this type of insulin schedule, you should eat  meals on a regular schedule.  If a meal is missed or significantly delayed, your blood sugar could go low.   Please come back for a follow-up appointment in  3 months.

## 2017-09-26 DIAGNOSIS — M1990 Unspecified osteoarthritis, unspecified site: Secondary | ICD-10-CM | POA: Diagnosis not present

## 2017-09-26 DIAGNOSIS — E039 Hypothyroidism, unspecified: Secondary | ICD-10-CM | POA: Diagnosis not present

## 2017-09-26 DIAGNOSIS — R5383 Other fatigue: Secondary | ICD-10-CM | POA: Diagnosis not present

## 2017-09-26 DIAGNOSIS — N183 Chronic kidney disease, stage 3 (moderate): Secondary | ICD-10-CM | POA: Diagnosis not present

## 2017-09-26 DIAGNOSIS — E1129 Type 2 diabetes mellitus with other diabetic kidney complication: Secondary | ICD-10-CM | POA: Diagnosis not present

## 2017-09-26 DIAGNOSIS — E7800 Pure hypercholesterolemia, unspecified: Secondary | ICD-10-CM | POA: Diagnosis not present

## 2017-09-26 DIAGNOSIS — E1149 Type 2 diabetes mellitus with other diabetic neurological complication: Secondary | ICD-10-CM | POA: Diagnosis not present

## 2017-09-26 DIAGNOSIS — R69 Illness, unspecified: Secondary | ICD-10-CM | POA: Diagnosis not present

## 2017-09-26 DIAGNOSIS — G629 Polyneuropathy, unspecified: Secondary | ICD-10-CM | POA: Diagnosis not present

## 2017-09-26 DIAGNOSIS — R809 Proteinuria, unspecified: Secondary | ICD-10-CM | POA: Diagnosis not present

## 2017-10-09 ENCOUNTER — Other Ambulatory Visit: Payer: Self-pay | Admitting: Endocrinology

## 2017-10-09 DIAGNOSIS — R69 Illness, unspecified: Secondary | ICD-10-CM | POA: Diagnosis not present

## 2017-10-17 NOTE — Progress Notes (Signed)
Joshua [name] was seen today in the movement disorders clinic for neurologic consultation at the request of Gaynelle Arabian, MD.  The consultation is for the evaluation of gait and memory changes.  Pt is accompanied by his daughter who is his POA and wife, who supplement the history.  His daughter brought in several notes for me to review prior to his visit.  Her biggest concern is the patient's memory loss and that started about 1 year ago and that got worse over the last year.  The memory seems to fluctuate.  The patients wife has Parkinson's disease and they are the only 2 in the home.  However, the patient is not able to assist much with the caregiving of his wife, who is still very functional.  He will get dressed and then forget that he has gotten dressed and try to put on another pair of pants on top of the first pair that he just put on.  Recently, he got his shaving cream mixed up with soap and sprayed shaving cream all over the bathroom.  He is still driving, and he will forget where to go.  He admits that he "messes up even in familiar places."   The pt has lived in the same home with his wife for 71 years.  They live in a one story home.  His wife has always taken care of finances.  He takes care of distributing his own medications but last week they got him his own pill box that his wife is preparing.  She wasn't sure that the medications were getting taken properly.  There are no word finding troubles.  No appetite troubles.  Pt admits that he sleeps most of the day.  Until December, he was working and since then, he really hasn't gotten into any type of routine.  There are no hallucinations.    01/31/14 update:  Pt is f/u today.  No one accompanies him back to the room.  I started the patient on aricept last visit.  The patient reports that he is doing well on this medication.  The patient did have his thyroid rechecked by his primary care physician since last visit as his TSH was  elevated at 5.16 last visit, but after treatment it had improved and TSH in June, 2015 was now 0.91.  However, his hemoglobin A1c in June was 9.2 and this was repeated on 12/29/2013 and was 9.3.  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  I set him up with physical therapy.  His B12 has been a little low and he admits that he only takes the oral supplement about 2-3 times per week.    08/01/14 update:  Patient is following up today for his vascular dementia.  He has a history of B12 deficiency.  We recheck this last visit and it was normal at 485 but he has since quit taking his B12.  He is on Aricept and is doing well with this medication.  He and his wife feel that he has been stable in terms of memory. He physically helps take care of his wife, who has PD.  His DM has not been well controlled  11/03/15 update:  The patient returns today, accompanied by his wife who supplements the history.  I have reviewed his records made available to me.  I have not seen him in about 15 months.  He has  a history of vascular dementia.  He is on Aricept.  He reports that he is managing his meds except insulin.  His daughter is helping draw his insulin up.  Last visit, I asked him to restart his oral B12 and he states that he is doing that.  I also told him that he needed to stop driving last visit unless he passed an occupational driving evaluation, which was not completed.  He reports that he is driving still.  His wife doesn't drive.  Reports that his daughter lives 800 feet behind his home.  Wife does the cooking some, daughter does some.  Wife does finances.  Wife reports that she is fairly healthy.    11/05/16 update:  Pt returns for f/u accompanied by his daughter who supplements the history.  The records that were made available to me were reviewed (Dr. Cordelia Pen records).  Pt remains on Aricept.  Daughter puts in pill box but patient  states that he is able to remember to take them and daughter agrees. Wife takes care of helping him with ADL's.  Wife does finances.    Daughter does cooking.  He is driving.  He does sleep/doze some during the day.  He is sleeping well at night.  10/21/17 update: Patient is seen today in follow-up for vascular dementia.  The patient is accompanied by his son who supplements the history.  Patient is on Aricept, 10 mg daily.  Patient has 24 hour/day care.  The last 3 weeks have been difficult, as the patient's wife has been in the hospital and a subacute nursing facility.  She should get out today.  His son has been taking care of him.  The patient has been waking up multiple times per night and asking where his wife is.  He apparently does not do this when his wife is home.  He keeps forgetting that she has been hospitalized.  His son states that they just started using a pillbox.  He is taking medication properly.  He gets very confused in the evening, but no hallucinations.   He is not driving.  Meals are cooked by his family and taken to him.  Neuroimaging has  previously been performed.  It is available for my review today.  A CT of the brain was performed in 2012 demonstrating atrophy  PREVIOUS MEDICATIONS: none to date  ALLERGIES:  No Known Allergies  CURRENT MEDICATIONS:  Current Outpatient Medications on File Prior to Visit  Medication Sig Dispense Refill  . B-D INS SYR ULTRAFINE 1CC/31G 31G X 5/16" 1 ML MISC USE WITH LEVEMIR ONCE DAILY  11  . Insulin Glargine (BASAGLAR KWIKPEN) 100 UNIT/ML SOPN Inject 0.65 mLs (65 Units total) into the skin every morning. 10 pen 11  . Insulin Pen Needle 32G X 4 MM MISC Used to give insulin injections once a day. 100 each 11  . levothyroxine (SYNTHROID, LEVOTHROID) 75 MCG tablet Take 75 mcg by mouth daily before breakfast.   3  . ONE TOUCH ULTRA TEST test strip TEST 2 TIMES DAILY 100 each 5  . simvastatin (ZOCOR) 40 MG tablet Take 20 mg by mouth every  evening.    . vitamin B-12 (CYANOCOBALAMIN) 1000 MCG tablet Take 1,000 mcg by mouth daily.     No current facility-administered medications on file prior to visit.     PAST MEDICAL HISTORY:   Past Medical History:  Diagnosis Date  . Asymptomatic stenosis of right carotid artery    MILD RIGHT ICA  <  40%  PER DUPLEX STUDY  2003  . BPH (benign prostatic hypertrophy)   . CKD (chronic kidney disease), stage III ([place])   . Frequency of urination   . GERD (gastroesophageal reflux disease)   . H/O hiatal hernia   . History of gastric ulcer   . Hyperlipidemia   . Hypothyroidism   . Nocturia   . Short of breath on exertion   . Type 2 diabetes mellitus ([place])   . Urgency of urination     PAST SURGICAL HISTORY:   Past Surgical History:  Procedure Laterality Date  . CARDIAC CATHETERIZATION  01-07-2000  DR Daneen Schick   ESSENTIALLY NORMAL CORONARY ARTERIES W/ MINIMAL PLAQUING RCA & LAD/ NORMAL LVF/ SEVERE TORTUOSITY AORTOILIAC AND FEMORAL TERRITORY / FALSE ABNORMAL CARDIOLITE  . LUMBAR [place]  . TOTAL KNEE ARTHROPLASTY Bilateral LEFT  10-04-2004/   RIGHT  03-17-2008  . TRANSURETHRAL RESECTION OF PROSTATE N/A 02/01/2013   Procedure: TRANSURETHRAL RESECTION OF THE PROSTATEAND TURB  WITH GYRUS INSTRUMENTS;  Surgeon: Bernestine Amass, MD;  Location: [facility];  Service: Urology;  Laterality: N/A;    SOCIAL HISTORY:   Social History   Socioeconomic History  . Marital status: Married    Spouse name: Not on file  . Number of children: Not on file  . Years of education: Not on file  . Highest education level: Not on file  Occupational History  . Not on file  Social Needs  . Financial resource strain: Not on file  . Food insecurity:    Worry: Not on file    Inability: Not on file  . Transportation needs:    Medical: Not on file    Non-medical: Not on file  Tobacco Use  . Smoking status: Never Smoker  . Smokeless tobacco: Never Used  Substance and Sexual  Activity  . Alcohol use: No  . Drug use: No  . Sexual activity: Not on file  Lifestyle  . Physical activity:    Days per week: Not on file    Minutes per session: Not on file  . Stress: Not on file  Relationships  . Social connections:    Talks on phone: Not on file    Gets together: Not on file    Attends religious service: Not on file    Active member of club or organization: Not on file    Attends meetings of clubs or organizations: Not on file    Relationship status: Not on file  . Intimate partner violence:    Fear of current or ex partner: Not on file    Emotionally abused: Not on file    Physically abused: Not on file    Forced sexual activity: Not on file  Other Topics Concern  . Not on file  Social History Narrative  . Not on file    FAMILY HISTORY:   Family Status  Relation Name Status  . Mother  Deceased       diabetes  . Father  Deceased       heart disease, diabetes  . Sister  Alive  . Brother  Alive  . Daughter  Alive  . Son  Alive  . MGM  Deceased  . MGF  Deceased  . PGM  Deceased  . PGF  Deceased  . Brother  Alive  . Brother  Alive  . Brother  Deceased       heart disease  . Brother  (Not Specified)  ROS: Denies any chest pain or shortness of breath.  Has trouble participating with some aspects (partly because of hearing issues)  PHYSICAL EXAMINATION:    VITALS:   Vitals:   10/21/17 1044  BP: 110/74  Pulse: 66  SpO2: 97%  Weight: 211 lb (95.7 kg)  Height: 5\' 10"  (1.778 m)    GEN:  The patient appears stated age and is in NAD. HEENT:  Normocephalic, atraumatic.  The mucous membranes are moist. The superficial temporal arteries are without ropiness or tenderness. CV:  RRR Lungs:  CTAB Neck/HEME:  There are no carotid bruits bilaterally.  Neurological examination:  Orientation:  Montreal Cognitive Assessment  10/21/2017 11/03/2015 08/01/2014  Visuospatial/ Executive (0/5) 1 1 2   Naming (0/3) 1 2 2   Attention: Read list of digits  (0/2) 2 1 1   Attention: Read list of letters (0/1) 1 1 1   Attention: Serial 7 subtraction starting at 100 (0/3) 1 0 1  Language: Repeat phrase (0/2) 0 2 2  Language : Fluency (0/1) 0 0 0  Abstraction (0/2) 0 0 0  Delayed Recall (0/5) 0 0 0  Orientation (0/6) 2 5 5   Total 8 12 14   Adjusted Score (based on education) 9 13 15    Cranial nerves: There is good facial symmetry.  Extraocular muscles are intact.  Speech is fluent and clear.  The soft palate rises symmetrically and there is no tongue deviation.  Hearing is markedly decreased to conversational tone. Sensation: Sensation is intact to  light touch through out. Motor: Strength is at least antigravity x4. Frontal release signs: There is a bilateral palmomental response.  There is a positive glabellar tap (Myerson sign).  No rooting reflex.  Movement examination: Tone: There is no increased tone in the upper extremities/LE Abnormal movements: There was a mild tremor of the outstretched hands Coordination:  There is no significant decremation with RAM's Gait and Station: The patient pushes off of the chair to arise.  He uses a walker to ambulate.  He is mildly unsteady.  ASSESSMENT/PLAN:  1.  Memory loss  -I do think that this is multifactorial, but think the primary etiology is vascular dementia.  He is doing well on aricept and he can continue that.  Discussed Namenda, but all of [place] decided that the benefits really do not outweigh the risks at this point in time.  -Talked about the importance of staying engaged within his home.  Talked about benefit of routine. 2.  gait changes.  -I do think he has evidence of vascular parkinsonism.  I do not think he has idiopathic Parkinson's disease.  We talked about the differences.  He has had no falls 3.  B12 deficiency  -Is on oral B12 supplements 4.  Follow-up within 1 year, sooner should new neurologic issues arise.

## 2017-10-21 ENCOUNTER — Encounter: Payer: Self-pay | Admitting: Neurology

## 2017-10-21 ENCOUNTER — Ambulatory Visit: Payer: Medicare HMO | Admitting: Neurology

## 2017-10-21 VITALS — BP 110/74 | HR 66 | Ht 70.0 in | Wt 211.0 lb

## 2017-10-21 DIAGNOSIS — F0150 Vascular dementia without behavioral disturbance: Secondary | ICD-10-CM

## 2017-10-21 DIAGNOSIS — G214 Vascular parkinsonism: Secondary | ICD-10-CM | POA: Diagnosis not present

## 2017-10-21 DIAGNOSIS — R69 Illness, unspecified: Secondary | ICD-10-CM | POA: Diagnosis not present

## 2017-10-21 MED ORDER — DONEPEZIL HCL 10 MG PO TABS: 10.0000 mg | ORAL_TABLET | Freq: Every day | ORAL | 3 refills | Status: AC

## 2017-10-29 DIAGNOSIS — H6120 Impacted cerumen, unspecified ear: Secondary | ICD-10-CM | POA: Diagnosis not present

## 2017-11-05 ENCOUNTER — Ambulatory Visit: Payer: Medicare HMO | Admitting: Neurology

## 2017-12-02 ENCOUNTER — Encounter: Payer: Self-pay | Admitting: Endocrinology

## 2017-12-02 ENCOUNTER — Ambulatory Visit: Payer: Medicare HMO | Admitting: Endocrinology

## 2017-12-02 VITALS — BP 122/64 | HR 53 | Ht 67.0 in | Wt 205.4 lb

## 2017-12-02 DIAGNOSIS — N183 Chronic kidney disease, stage 3 unspecified: Secondary | ICD-10-CM

## 2017-12-02 DIAGNOSIS — Z794 Long term (current) use of insulin: Secondary | ICD-10-CM | POA: Diagnosis not present

## 2017-12-02 DIAGNOSIS — E1122 Type 2 diabetes mellitus with diabetic chronic kidney disease: Secondary | ICD-10-CM

## 2017-12-02 LAB — POCT GLYCOSYLATED HEMOGLOBIN (HGB A1C): Hemoglobin A1C: 9.5 % — AB (ref 4.0–5.6)

## 2017-12-02 NOTE — Patient Instructions (Signed)
check your blood sugar twice a day.  vary the time of day when you check, between before the 3 meals, and at bedtime.  also check if you have symptoms of your blood sugar being too high or too low.  please keep a record of the readings and bring it to your next appointment here (or you can bring the meter itself).  You can write it on any piece of paper.  please call us sooner if your blood sugar goes below 70, or if you have a lot of readings over 200.  To avoid low blood sugar in the middle of the night, eat a light snack at bedtime.   Please continue the same insulin.   On this type of insulin schedule, you should eat meals on a regular schedule.  If a meal is missed or significantly delayed, your blood sugar could go low.   Please come back for a follow-up appointment in 3 months.

## 2017-12-02 NOTE — Progress Notes (Signed)
Subjective:    Patient ID: Joshua [name], male    DOB: 01/14/1929, 84 y.o.   MRN: [id number]  HPI Pt returns for f/u of diabetes mellitus:  DM type: Insulin-requiring type 2.  Dx'ed: 1996.  Complications: polyneuropathy, CAD, retinopathy, and renal failure.  Therapy: insulin since 2014. DKA: never.   Severe hypoglycemia: never.  Pancreatitis: never.   Other: he takes QD insulin; no one is at home to give him multiple daily injections or to check cbg at any time other than fasting; on AM NPH, he had afternoon hypoglycemia; levemir was too expensive.  dtr prefers syringe and vial, as she draws up for him, and pt administers to himself.  dtr checks fasting cbg when she is there, but pt does not check cbg's at other times.   Interval history: dtr says he never misses the insulin.  Meter is downloaded today, and the printout is scanned into the record.  cbg's vary from 100 up to the low-200's.  dtr provides hx, due pt's poor health. Main symptom is again fatigue.   Past Medical History:  Diagnosis Date  . Asymptomatic stenosis of right carotid artery    MILD RIGHT ICA  <40%  PER DUPLEX STUDY  2003  . BPH (benign prostatic hypertrophy)   . CKD (chronic kidney disease), stage III ([place])   . Frequency of urination   . GERD (gastroesophageal reflux disease)   . H/O hiatal hernia   . History of gastric ulcer   . Hyperlipidemia   . Hypothyroidism   . Nocturia   . Short of breath on exertion   . Type 2 diabetes mellitus ([place])   . Urgency of urination     Past Surgical History:  Procedure Laterality Date  . CARDIAC CATHETERIZATION  01-07-2000  DR Daneen Schick   ESSENTIALLY NORMAL CORONARY ARTERIES W/ MINIMAL PLAQUING RCA & LAD/ NORMAL LVF/ SEVERE TORTUOSITY AORTOILIAC AND FEMORAL TERRITORY / FALSE ABNORMAL CARDIOLITE  . LUMBAR [place]  . TOTAL KNEE ARTHROPLASTY Bilateral LEFT  10-04-2004/   RIGHT  03-17-2008  . TRANSURETHRAL RESECTION OF PROSTATE N/A 02/01/2013   Procedure:  TRANSURETHRAL RESECTION OF THE PROSTATEAND TURB  WITH GYRUS INSTRUMENTS;  Surgeon: Bernestine Amass, MD;  Location: [facility];  Service: Urology;  Laterality: N/A;    Social History   Socioeconomic History  . Marital status: Married    Spouse name: Not on file  . Number of children: Not on file  . Years of education: Not on file  . Highest education level: Not on file  Occupational History  . Not on file  Social Needs  . Financial resource strain: Not on file  . Food insecurity:    Worry: Not on file    Inability: Not on file  . Transportation needs:    Medical: Not on file    Non-medical: Not on file  Tobacco Use  . Smoking status: Never Smoker  . Smokeless tobacco: Never Used  Substance and Sexual Activity  . Alcohol use: No  . Drug use: No  . Sexual activity: Not on file  Lifestyle  . Physical activity:    Days per week: Not on file    Minutes per session: Not on file  . Stress: Not on file  Relationships  . Social connections:    Talks on phone: Not on file    Gets together: Not on file    Attends religious service: Not on file    Active member  of club or organization: Not on file    Attends meetings of clubs or organizations: Not on file    Relationship status: Not on file  . Intimate partner violence:    Fear of current or ex partner: Not on file    Emotionally abused: Not on file    Physically abused: Not on file    Forced sexual activity: Not on file  Other Topics Concern  . Not on file  Social History Narrative  . Not on file    Current Outpatient Medications on File Prior to Visit  Medication Sig Dispense Refill  . B-D INS SYR ULTRAFINE 1CC/31G 31G X 5/16" 1 ML MISC USE WITH LEVEMIR ONCE DAILY  11  . donepezil (ARICEPT) 10 MG tablet Take 1 tablet (10 mg total) by mouth at bedtime. 90 tablet 3  . Insulin Glargine (BASAGLAR KWIKPEN) 100 UNIT/ML SOPN Inject 0.65 mLs (65 Units total) into the skin every morning. 10 pen 11  . Insulin Pen  Needle 32G X 4 MM MISC Used to give insulin injections once a day. 100 each 11  . levothyroxine (SYNTHROID, LEVOTHROID) 75 MCG tablet Take 75 mcg by mouth daily before breakfast.   3  . ONE TOUCH ULTRA TEST test strip TEST 2 TIMES DAILY 100 each 5  . simvastatin (ZOCOR) 40 MG tablet Take 20 mg by mouth every evening.    . vitamin B-12 (CYANOCOBALAMIN) 1000 MCG tablet Take 1,000 mcg by mouth daily.     No current facility-administered medications on file prior to visit.     No Known Allergies  Family History  Problem Relation Age of Onset  . Diabetes Mother   . Diabetes Father   . Heart disease Father   . Heart disease Brother     BP 122/64 (BP Location: Left Arm, Patient Position: Sitting, Cuff Size: Normal)   Pulse (!) 53   Ht 5\' 7"  (1.702 m)   Wt 205 lb 6.4 oz (93.2 kg)   SpO2 97%   BMI 32.17 kg/m    Review of Systems He seldom has hypoglycemia, and these episodes are mild.      Objective:   Physical Exam VITAL SIGNS:  See vs page.  GENERAL: no distress.  Pulses: foot pulses are intact bilaterally.   MSK: no deformity of the feet or ankles.  CV: 1+ bilat leg edema of the legs, and bilat vv's.  Skin:  no ulcer on the feet or ankles.  normal color and temp on the feet and ankles.  Neuro: sensation is intact to touch on the feet and ankles, but decreased from normal.   Ext: There is bilateral onychomycosis of the toenails.    A1c=9.5%     Assessment & Plan:  Insulin-requiring type 2 DM, with renal failure: Improved.  Frail elderly state: he is not a candidate for aggressive glycemic control.   Patient Instructions  check your blood sugar twice a day.  vary the time of day when you check, between before the 3 meals, and at bedtime.  also check if you have symptoms of your blood sugar being too high or too low.  please keep a record of the readings and bring it to your next appointment here (or you can bring the meter itself).  You can write it on any piece of paper.   please call us sooner if your blood sugar goes below 70, or if you have a lot of readings over 200.  To avoid low blood sugar in  the middle of the night, eat a light snack at bedtime.   Please continue the same insulin.   On this type of insulin schedule, you should eat meals on a regular schedule.  If a meal is missed or significantly delayed, your blood sugar could go low.   Please come back for a follow-up appointment in 3 months.

## 2018-01-29 DIAGNOSIS — H353221 Exudative age-related macular degeneration, left eye, with active choroidal neovascularization: Secondary | ICD-10-CM | POA: Diagnosis not present

## 2018-01-29 DIAGNOSIS — H353212 Exudative age-related macular degeneration, right eye, with inactive choroidal neovascularization: Secondary | ICD-10-CM | POA: Diagnosis not present

## 2018-01-29 DIAGNOSIS — H353222 Exudative age-related macular degeneration, left eye, with inactive choroidal neovascularization: Secondary | ICD-10-CM | POA: Diagnosis not present

## 2018-01-29 DIAGNOSIS — H353133 Nonexudative age-related macular degeneration, bilateral, advanced atrophic without subfoveal involvement: Secondary | ICD-10-CM | POA: Diagnosis not present

## 2018-03-04 ENCOUNTER — Ambulatory Visit: Payer: Medicare HMO | Admitting: Endocrinology

## 2018-03-30 DIAGNOSIS — G629 Polyneuropathy, unspecified: Secondary | ICD-10-CM | POA: Diagnosis not present

## 2018-03-30 DIAGNOSIS — R809 Proteinuria, unspecified: Secondary | ICD-10-CM | POA: Diagnosis not present

## 2018-03-30 DIAGNOSIS — E1129 Type 2 diabetes mellitus with other diabetic kidney complication: Secondary | ICD-10-CM | POA: Diagnosis not present

## 2018-03-30 DIAGNOSIS — N183 Chronic kidney disease, stage 3 (moderate): Secondary | ICD-10-CM | POA: Diagnosis not present

## 2018-03-30 DIAGNOSIS — D692 Other nonthrombocytopenic purpura: Secondary | ICD-10-CM | POA: Diagnosis not present

## 2018-03-30 DIAGNOSIS — E7800 Pure hypercholesterolemia, unspecified: Secondary | ICD-10-CM | POA: Diagnosis not present

## 2018-03-30 DIAGNOSIS — E039 Hypothyroidism, unspecified: Secondary | ICD-10-CM | POA: Diagnosis not present

## 2018-03-30 DIAGNOSIS — M1990 Unspecified osteoarthritis, unspecified site: Secondary | ICD-10-CM | POA: Diagnosis not present

## 2018-03-30 DIAGNOSIS — R69 Illness, unspecified: Secondary | ICD-10-CM | POA: Diagnosis not present

## 2018-03-30 DIAGNOSIS — E1149 Type 2 diabetes mellitus with other diabetic neurological complication: Secondary | ICD-10-CM | POA: Diagnosis not present

## 2018-04-17 ENCOUNTER — Telehealth: Payer: Self-pay | Admitting: Endocrinology

## 2018-04-17 ENCOUNTER — Other Ambulatory Visit: Payer: Self-pay

## 2018-04-17 MED ORDER — INSULIN GLARGINE 100 UNIT/ML SOLOSTAR PEN: 65.0000 [IU] | PEN_INJECTOR | SUBCUTANEOUS | 99 refills | Status: AC

## 2018-04-17 NOTE — Telephone Encounter (Signed)
Ok.  Same dosage 

## 2018-04-17 NOTE — Telephone Encounter (Signed)
Please advise 

## 2018-04-17 NOTE — Telephone Encounter (Signed)
Patient is changing to [employer] and daughter would like to know if he can be changed to the Lantus since this is covered with the insurance.

## 2018-04-17 NOTE — Telephone Encounter (Signed)
Refill sent as requested. Called daughter and made her aware of change. Verbalized acceptance and understanding.

## 2018-04-20 ENCOUNTER — Encounter: Payer: Self-pay | Admitting: Endocrinology

## 2018-04-20 ENCOUNTER — Ambulatory Visit (INDEPENDENT_AMBULATORY_CARE_PROVIDER_SITE_OTHER): Payer: HMO | Admitting: Endocrinology

## 2018-04-20 VITALS — BP 126/86 | HR 57 | Ht 67.0 in | Wt 205.4 lb

## 2018-04-20 DIAGNOSIS — N183 Chronic kidney disease, stage 3 unspecified: Secondary | ICD-10-CM

## 2018-04-20 DIAGNOSIS — E1122 Type 2 diabetes mellitus with diabetic chronic kidney disease: Secondary | ICD-10-CM

## 2018-04-20 DIAGNOSIS — Z794 Long term (current) use of insulin: Secondary | ICD-10-CM | POA: Diagnosis not present

## 2018-04-20 LAB — POCT GLYCOSYLATED HEMOGLOBIN (HGB A1C): Hemoglobin A1C: 10.3 % — AB (ref 4.0–5.6)

## 2018-04-20 NOTE — Progress Notes (Signed)
Subjective:    Patient ID: Joshua [name], male    DOB: [birth year], 85 y.o.   MRN: [id number]  HPI Pt returns for f/u of diabetes mellitus:  DM type: Insulin-requiring type 2.  Dx'ed: 1996.  Complications: polyneuropathy, CAD, retinopathy, and renal failure.  Therapy: insulin since 2014. DKA: never.   Severe hypoglycemia: never.  Pancreatitis: never.   Other: he takes QD insulin; no one is at home to give him multiple daily injections or to check cbg at any time other than fasting; on AM NPH, he had afternoon hypoglycemia; levemir was too expensive.  dtr prefers syringe and vial, as she draws up for him, and pt administers to himself.  dtr checks fasting cbg when she is there, but pt does not check cbg's at other times.   Interval history: pt is here with son today.  he never misses the insulin, but cbg is not checked recently.  son provides hx, due pt's poor health.  No new sxs.   Past Medical History:  Diagnosis Date  . Asymptomatic stenosis of right carotid artery    MILD RIGHT ICA  <40%  PER DUPLEX STUDY  2003  . BPH (benign prostatic hypertrophy)   . CKD (chronic kidney disease), stage III ([place])   . Frequency of urination   . GERD (gastroesophageal reflux disease)   . H/O hiatal hernia   . History of gastric ulcer   . Hyperlipidemia   . Hypothyroidism   . Nocturia   . Short of breath on exertion   . Type 2 diabetes mellitus ([place])   . Urgency of urination     Past Surgical History:  Procedure Laterality Date  . CARDIAC CATHETERIZATION  01-07-2000  DR Daneen Schick   ESSENTIALLY NORMAL CORONARY ARTERIES W/ MINIMAL PLAQUING RCA & LAD/ NORMAL LVF/ SEVERE TORTUOSITY AORTOILIAC AND FEMORAL TERRITORY / FALSE ABNORMAL CARDIOLITE  . LUMBAR [place]  . TOTAL KNEE ARTHROPLASTY Bilateral LEFT  10-04-2004/   RIGHT  03-17-2008  . TRANSURETHRAL RESECTION OF PROSTATE N/A 02/01/2013   Procedure: TRANSURETHRAL RESECTION OF THE PROSTATEAND TURB  WITH GYRUS INSTRUMENTS;   Surgeon: Bernestine Amass, MD;  Location: [facility];  Service: Urology;  Laterality: N/A;    Social History   Socioeconomic History  . Marital status: Married    Spouse name: Not on file  . Number of children: Not on file  . Years of education: Not on file  . Highest education level: Not on file  Occupational History  . Not on file  Social Needs  . Financial resource strain: Not on file  . Food insecurity:    Worry: Not on file    Inability: Not on file  . Transportation needs:    Medical: Not on file    Non-medical: Not on file  Tobacco Use  . Smoking status: Never Smoker  . Smokeless tobacco: Never Used  Substance and Sexual Activity  . Alcohol use: No  . Drug use: No  . Sexual activity: Not on file  Lifestyle  . Physical activity:    Days per week: Not on file    Minutes per session: Not on file  . Stress: Not on file  Relationships  . Social connections:    Talks on phone: Not on file    Gets together: Not on file    Attends religious service: Not on file    Active member of club or organization: Not on file    Attends meetings  of clubs or organizations: Not on file    Relationship status: Not on file  . Intimate partner violence:    Fear of current or ex partner: Not on file    Emotionally abused: Not on file    Physically abused: Not on file    Forced sexual activity: Not on file  Other Topics Concern  . Not on file  Social History Narrative  . Not on file    Current Outpatient Medications on File Prior to Visit  Medication Sig Dispense Refill  . B-D INS SYR ULTRAFINE 1CC/31G 31G X 5/16" 1 ML MISC USE WITH LEVEMIR ONCE DAILY  11  . donepezil (ARICEPT) 10 MG tablet Take 1 tablet (10 mg total) by mouth at bedtime. 90 tablet 3  . Insulin Glargine (LANTUS SOLOSTAR) 100 UNIT/ML Solostar Pen Inject 65 Units into the skin every morning. 5 pen PRN  . Insulin Pen Needle 32G X 4 MM MISC Used to give insulin injections once a day. 100 each 11  .  levothyroxine (SYNTHROID, LEVOTHROID) 75 MCG tablet Take 75 mcg by mouth daily before breakfast.   3  . ONE TOUCH ULTRA TEST test strip TEST 2 TIMES DAILY 100 each 5  . simvastatin (ZOCOR) 40 MG tablet Take 20 mg by mouth every evening.    . vitamin B-12 (CYANOCOBALAMIN) 1000 MCG tablet Take 1,000 mcg by mouth daily.     No current facility-administered medications on file prior to visit.     No Known Allergies  Family History  Problem Relation Age of Onset  . Diabetes Mother   . Diabetes Father   . Heart disease Father   . Heart disease Brother     BP 126/86 (BP Location: Right Arm, Patient Position: Sitting, Cuff Size: Normal)   Pulse (!) 57   Ht 5\' 7"  (1.702 m)   Wt 205 lb 6.4 oz (93.2 kg)   SpO2 94%   BMI 32.17 kg/m    Review of Systems He denies hypoglycemia.      Objective:   Physical Exam VITAL SIGNS:  See vs page GENERAL: no distress.  In wheelchair Pulses: foot pulses are intact bilaterally.   MSK: no deformity of the feet or ankles.  CV: 1+ bilat leg edema of the legs, and bilat vv's.  Skin:  no ulcer on the feet or ankles.  normal color and temp on the feet and ankles.  Neuro: sensation is intact to touch on the feet and ankles, but decreased from normal.   Ext: There is bilateral onychomycosis of the toenails.    Lab Results  Component Value Date   HGBA1C 10.3 (A) 04/20/2018   Lab Results  Component Value Date   CREATININE 1.53 (H) 02/02/2013   BUN 19 02/02/2013   NA 139 02/02/2013   K 4.4 02/02/2013   CL 106 02/02/2013   CO2 27 02/02/2013       Assessment & Plan:  Insulin-requiring type 2 DM, with DR: worse Renal failure: he is at risk for nocturnal hypoglycemia. Frail elderly status: he is not a candidate for aggressive glycemic control.  We discussed.  We decided not to increase insulin at this visit.    Patient Instructions  check your blood sugar twice a day.  vary the time of day when you check, between before the 3 meals, and at  bedtime.  also check if you have symptoms of your blood sugar being too high or too low.  please keep a record of the readings  and bring it to your next appointment here (or you can bring the meter itself).  You can write it on any piece of paper.  please call us sooner if your blood sugar goes below 70, or if you have a lot of readings over 200.  To avoid low blood sugar in the middle of the night, eat a light snack at bedtime.   Please continue the same insulin.   On this type of insulin schedule, you should eat meals on a regular schedule.  If a meal is missed or significantly delayed, your blood sugar could go low.   Please come back for a follow-up appointment in 2 months.

## 2018-04-20 NOTE — Patient Instructions (Signed)
check your blood sugar twice a day.  vary the time of day when you check, between before the 3 meals, and at bedtime.  also check if you have symptoms of your blood sugar being too high or too low.  please keep a record of the readings and bring it to your next appointment here (or you can bring the meter itself).  You can write it on any piece of paper.  please call us sooner if your blood sugar goes below 70, or if you have a lot of readings over 200.  To avoid low blood sugar in the middle of the night, eat a light snack at bedtime.   Please continue the same insulin.   On this type of insulin schedule, you should eat meals on a regular schedule.  If a meal is missed or significantly delayed, your blood sugar could go low.   Please come back for a follow-up appointment in 2 months.

## 2018-04-30 ENCOUNTER — Telehealth: Payer: Self-pay | Admitting: Neurology

## 2018-04-30 NOTE — Telephone Encounter (Signed)
Received note from [facility] and [place] that states patient was submitted to hospice care 04/28/2018. Dr. Carles Collet Juluis Rainier.

## 2018-04-30 NOTE — Telephone Encounter (Signed)
ok 

## 2018-05-13 ENCOUNTER — Telehealth: Payer: Self-pay

## 2018-05-13 NOTE — Telephone Encounter (Signed)
lft vm for Joshua [name] with instructions and call back # for questions

## 2018-05-13 NOTE — Telephone Encounter (Signed)
Called Joshua [name] to inform of new orders. LVM requesting returned call.

## 2018-05-13 NOTE — Telephone Encounter (Signed)
Cyril Mourning stated that she needs a new order for this increase faxed to her at [phone]

## 2018-05-13 NOTE — Telephone Encounter (Signed)
Orders signed and faxed. Confirmation received.

## 2018-05-13 NOTE — Telephone Encounter (Signed)
I printed  

## 2018-05-13 NOTE — Telephone Encounter (Signed)
Spoke to [employer] nurse for [phone]) and she stated that pt has been having elevated blood sugars according to pt family, only reading she was able to provide was 42 which was yesterday when she was at the home. I called pt daughter and she stated that pt has been having blood sugars from 220 in a.m. to over 500's in the evenings. Pt is currently on 65 units of lantus every a.m. and also having polyuria and confusion per hospice nurse, please advise

## 2018-05-13 NOTE — Telephone Encounter (Signed)
Please increase lantus to 68 units qam.

## 2018-05-16 DEATH — deceased

## 2018-06-14 DEATH — deceased

## 2018-06-23 ENCOUNTER — Ambulatory Visit: Payer: PPO | Admitting: Endocrinology

## 2018-07-09 ENCOUNTER — Other Ambulatory Visit: Payer: Self-pay

## 2018-07-09 NOTE — Patient Outreach (Signed)
  [facility]) Care Management Chronic Special Needs Program    07/09/2018  Name: Joshua [name], DOB: [birth year]  MRN: [id number]   Mr. Joshua [name] is enrolled in a chronic special needs plan for Diabetes  Case closed Client disenrolled as he is deceased.  Peter Garter RN, Jackquline [place], CDE Chronic Care Management Coordinator [facility] Care Management [phone]

## 2018-10-22 ENCOUNTER — Ambulatory Visit: Payer: HMO | Admitting: Neurology
